# Patient Record
Sex: Male | Born: 1970
Health system: Southern US, Community
[De-identification: ages and names within clinical notes are randomized; demographics above are authoritative.]

## PROBLEM LIST (undated history)

## (undated) DIAGNOSIS — M7750 Other enthesopathy of unspecified foot: Secondary | ICD-10-CM

## (undated) DIAGNOSIS — G5702 Lesion of sciatic nerve, left lower limb: Secondary | ICD-10-CM

## (undated) HISTORY — DX: Lesion of sciatic nerve, left lower limb: G57.02

## (undated) HISTORY — PX: WISDOM TOOTH EXTRACTION: SHX21

## (undated) HISTORY — DX: Other enthesopathy of unspecified foot and ankle: M77.50

---

## 2009-04-08 ENCOUNTER — Emergency Department (HOSPITAL_COMMUNITY): Admission: EM | Admit: 2009-04-08 | Discharge: 2009-04-08 | Payer: Self-pay | Admitting: Emergency Medicine

## 2009-04-12 ENCOUNTER — Ambulatory Visit: Admission: RE | Admit: 2009-04-12 | Discharge: 2009-04-12 | Payer: Self-pay | Admitting: Diagnostic Radiology

## 2009-04-15 ENCOUNTER — Emergency Department (HOSPITAL_COMMUNITY): Admission: EM | Admit: 2009-04-15 | Discharge: 2009-04-15 | Payer: Self-pay | Admitting: Emergency Medicine

## 2009-04-22 ENCOUNTER — Emergency Department (HOSPITAL_COMMUNITY): Admission: EM | Admit: 2009-04-22 | Discharge: 2009-04-22 | Payer: Self-pay | Admitting: Emergency Medicine

## 2010-11-15 ENCOUNTER — Encounter
Admission: RE | Admit: 2010-11-15 | Discharge: 2010-11-15 | Payer: Self-pay | Source: Home / Self Care | Attending: Family Medicine | Admitting: Family Medicine

## 2010-12-25 ENCOUNTER — Other Ambulatory Visit: Payer: Self-pay | Admitting: Family Medicine

## 2010-12-25 ENCOUNTER — Ambulatory Visit
Admission: RE | Admit: 2010-12-25 | Discharge: 2010-12-25 | Disposition: A | Discharge status: Home / Self Care | Payer: BC Managed Care – PPO | Source: Ambulatory Visit | Attending: *Deleted | Admitting: *Deleted

## 2010-12-25 DIAGNOSIS — J189 Pneumonia, unspecified organism: Secondary | ICD-10-CM

## 2011-03-15 ENCOUNTER — Other Ambulatory Visit: Payer: Self-pay | Admitting: *Deleted

## 2011-03-15 ENCOUNTER — Other Ambulatory Visit: Payer: Self-pay | Admitting: Family Medicine

## 2011-03-15 DIAGNOSIS — J189 Pneumonia, unspecified organism: Secondary | ICD-10-CM

## 2015-04-27 ENCOUNTER — Ambulatory Visit: Payer: Self-pay | Admitting: Sports Medicine

## 2015-05-03 ENCOUNTER — Ambulatory Visit (INDEPENDENT_AMBULATORY_CARE_PROVIDER_SITE_OTHER): Payer: BLUE CROSS/BLUE SHIELD | Admitting: Sports Medicine

## 2015-05-03 ENCOUNTER — Encounter: Payer: Self-pay | Admitting: Sports Medicine

## 2015-05-03 VITALS — BP 100/57 | Ht 71.0 in | Wt 150.0 lb

## 2015-05-03 DIAGNOSIS — M7752 Other enthesopathy of left foot: Secondary | ICD-10-CM | POA: Diagnosis not present

## 2015-05-03 DIAGNOSIS — G5702 Lesion of sciatic nerve, left lower limb: Secondary | ICD-10-CM

## 2015-05-03 DIAGNOSIS — M7750 Other enthesopathy of unspecified foot: Secondary | ICD-10-CM | POA: Insufficient documentation

## 2015-05-03 HISTORY — DX: Lesion of sciatic nerve, left lower limb: G57.02

## 2015-05-03 HISTORY — DX: Other enthesopathy of unspecified foot and ankle: M77.50

## 2015-05-03 NOTE — Assessment & Plan Note (Signed)
-  Improving -Continue stretches and add hip rotation rehabilitation exercises

## 2015-05-03 NOTE — Progress Notes (Signed)
   Subjective:    Patient ID: Kyle Turner, male    DOB: 05-Jul-1971, 44 y.o.   MRN: [id number]  HPI Mr. Baloun is a 44 year old male new patient who presents with concern over left buttock and left heel pain. Onset of symptoms was last fall when he was training for some distance races. He enjoys doing marathons and 50 K races. He denies any acute injury. Location of the left heel pain is right over the distal Achilles tendon and just anterior to it in the region of the retrocalcaneal bursa. This is aggravated with running and relieved with rest. He has not tried any specific relieving factors. For the left buttock pain, location of pain is primarily in the left posterior gluteal region. He denies any radicular symptoms, numbness, tingling loss of bladder bowel, saddle anesthesia, groin pain, or hip clicking or catching. Symptoms are worse if he presses on it. He finds relief with doing the legs crossed pulling knees toward chest stretch. This is overall improving. He denies any recent changes in training regimen or footwear. No bruising or swelling.  Past medical history, social history, medications, and allergies were reviewed and are up to date in the chart.  Review of Systems 7 point review of systems was performed and was otherwise negative unless noted in the history of present illness.     Objective:   Physical Exam BP 100/57 mmHg  Ht 5\' 11"  (1.803 m)  Wt 150 lb (68.04 kg)  BMI 20.93 kg/m2 GEN: The patient is well-developed well-nourished male and in no acute distress.  He is awake alert and oriented x3. SKIN: warm and well-perfused, no rash  EXTR: No lower extremity edema or calf tenderness Neuro: Strength 5/5 globally. Sensation intact throughout. No focal deficits. Vasc: +2 bilateral distal pulses. No edema.  MSK: Gait analysis reveals that he is a forefoot striker and places much more pressure across the posterior gastrocsoleus complex to maintain his form. He initially has a  significant amount of forefoot motion, but this was corrected in green sport insoles. No significant leg length discrepancy. He is able to do a single leg heel lifts bilaterally, but this aggravates his left heel pain. The Achilles tendons are palpably intact without nodularity. No insertional Achilles pain. No bruising or swelling. No skin changes. He is tender over the left piriformis. Negative seated straight leg raise. Examination of the bilateral feet reveal that he has dropping of the right fourth metatarsal head. He has a Morton's foot bilaterally. Slight loss of the right transverse arch. Right mid-to-forefoot breakdown with dorsal bossing.   Limited musculoskeletal ultrasound: Long and short axis views were obtained of the bilateral Achilles tendons. These appear to be normal in caliber and without evidence of tearing. The left Achilles measures 0.42 cm in thickness and the right is 0.38 cm. On the left there appears to be an increased amount of retrocalcaneal bursal fluid with a small microcalcification.     Assessment & Plan:  Please see problem based assessment and plan in the problem list.

## 2015-05-03 NOTE — Assessment & Plan Note (Signed)
Secondary to his gait mechanics caused by impact at the posterior aspect of his running shoes -Green sport insoles were fitted and applied with bilateral small heel wedges, which improved his gait -He will do some eccentric Achilles exercises as well. -Plan follow-up in 6-8 weeks if needed or sooner if acutely worsened

## 2015-07-07 ENCOUNTER — Encounter: Payer: Self-pay | Admitting: Cardiology

## 2015-08-25 ENCOUNTER — Ambulatory Visit: Payer: BLUE CROSS/BLUE SHIELD | Admitting: Cardiovascular Disease

## 2015-08-30 ENCOUNTER — Ambulatory Visit (INDEPENDENT_AMBULATORY_CARE_PROVIDER_SITE_OTHER): Payer: BLUE CROSS/BLUE SHIELD | Admitting: Cardiology

## 2015-08-30 ENCOUNTER — Encounter: Payer: Self-pay | Admitting: *Deleted

## 2015-08-30 VITALS — BP 120/58 | HR 45 | Ht 71.0 in | Wt 152.0 lb

## 2015-08-30 DIAGNOSIS — R0602 Shortness of breath: Secondary | ICD-10-CM

## 2015-08-30 DIAGNOSIS — R079 Chest pain, unspecified: Secondary | ICD-10-CM | POA: Diagnosis not present

## 2015-08-30 NOTE — Progress Notes (Signed)
Cardiology Office Note   Date:  08/30/2015   ID:  Kyle AlbeeJohn Turner, DOB [birth year], MRN [id number]  PCP:  Jones BalesLANTELME,BRUCE E, MD    Chief Complaint  Patient presents with  . Shortness of Breath  . Chest Pain      History of Present Illness: Kyle Turner is a 44 y.o. male who presents for evaluation of SOB with running.  He has run a lot of marathons in the past but over the past 4 months he finds it harder to run with SOB and fatgue.  He has also noticed some vague discomfort in his left chest that is described as a fullness which is intermittent and lasts about 15 minutes and is nonexertional.  There is no radiation of the discomfort and no associated nausea/diaphoresis.  He denies any LE edema, palpitations or syncope.  He has noticed some dizziness when turning his head when he goes to lay down.     History reviewed. No pertinent past medical history.  Past Surgical History  Procedure Laterality Date  . Wisdom tooth extraction       No current outpatient prescriptions on file.   No current facility-administered medications for this visit.    Allergies:   Review of patient's allergies indicates no known allergies.    Social History:  The patient  reports that he has never smoked. He has never used smokeless tobacco. He reports that he drinks alcohol. He reports that he does not use illicit drugs.   Family History:  The patient's family history includes Heart disease in his paternal grandmother; Hypertension in his father and mother; Other in his father. There is no history of Heart failure, Fainting, or Stroke.    ROS:  Please see the history of present illness.   Otherwise, review of systems are positive for none.   All other systems are reviewed and negative.    PHYSICAL EXAM: VS:  BP 120/58 mmHg  Pulse 45  Ht 5\' 11"  (1.803 m)  Wt 152 lb (68.947 kg)  BMI 21.21 kg/m2 , BMI Body mass index is 21.21 kg/(m^2). GEN: Well nourished, well developed, in no  acute distress HEENT: normal Neck: no JVD, carotid bruits, or masses Cardiac: RRR; no murmurs, rubs, or gallops,no edema  Respiratory:  clear to auscultation bilaterally, normal work of breathing GI: soft, nontender, nondistended, + BS MS: no deformity or atrophy Skin: warm and dry, no rash Neuro:  Strength and sensation are intact Psych: euthymic mood, full affect   EKG:  EKG is ordered today. The ekg ordered today demonstrates sinus bradycardia at 45bpm with LVH by voltage    Recent Labs: No results found for requested labs within last 365 days.    Lipid Panel No results found for: CHOL, TRIG, HDL, CHOLHDL, VLDL, LDLCALC, LDLDIRECT    Wt Readings from Last 3 Encounters:  08/30/15 152 lb (68.947 kg)  05/03/15 150 lb (68.04 kg)        ASSESSMENT AND PLAN:  1.  Atypical chest pain that is very vague in description with no associated symptoms.  His EKG is nonischemic. I will get an ETT to rule out ischemia and also a coronary calcium score.  2.  SOB that is most likely due to deconditioning.  He is a marathon runner and backed off of running over the summer due to the heat and now notices SOB with running.  He has LVH on  his EKG.  I will get an echo to assess further.     Current medicines are reviewed at length with the patient today.  The patient does not have concerns regarding medicines.  The following changes have been made:  no change  Labs/ tests ordered today: See above Assessment and Plan No orders of the defined types were placed in this encounter.     Disposition:   FU with me PRN pending results of studies  SignedQuintella Reichert, MD  08/30/2015 11:44 AM    [facility] [address] [place], [place], [place]  [address] Phone: [phone]; Fax: [phone]

## 2015-08-30 NOTE — Patient Instructions (Addendum)
Medication Instructions:  Your physician recommends that you continue on your current medications as directed. Please refer to the Current Medication list given to you today.   Labwork: None  Testing/Procedures: Your physician has requested that you have an echocardiogram. Echocardiography is a painless test that uses sound waves to create images of your heart. It provides your doctor with information about the size and shape of your heart and how well your heart's chambers and valves are working. This procedure takes approximately one hour. There are no restrictions for this procedure.   Your physician has requested that you have an exercise tolerance test. For further information please visit [email or web address]. Please also follow instruction sheet, as given.  Dr. Mayford Knifeurner recommends you have a CALCIUM SCORE.  Follow-Up: Your physician recommends that you schedule a follow-up appointment AS NEEDED with Dr. Mayford Knifeurner pending your study results.  Any Other Special Instructions Will Be Listed Below (If Applicable).

## 2015-09-06 ENCOUNTER — Ambulatory Visit (INDEPENDENT_AMBULATORY_CARE_PROVIDER_SITE_OTHER)
Admission: RE | Admit: 2015-09-06 | Discharge: 2015-09-06 | Disposition: A | Discharge status: Home / Self Care | Payer: BLUE CROSS/BLUE SHIELD | Source: Ambulatory Visit | Attending: Cardiovascular Disease | Admitting: Cardiovascular Disease

## 2015-09-06 ENCOUNTER — Ambulatory Visit (HOSPITAL_COMMUNITY): Payer: BLUE CROSS/BLUE SHIELD | Attending: Cardiology

## 2015-09-06 ENCOUNTER — Other Ambulatory Visit: Payer: Self-pay

## 2015-09-06 DIAGNOSIS — R079 Chest pain, unspecified: Secondary | ICD-10-CM | POA: Insufficient documentation

## 2015-09-06 DIAGNOSIS — R0602 Shortness of breath: Secondary | ICD-10-CM | POA: Diagnosis not present

## 2015-09-06 DIAGNOSIS — I358 Other nonrheumatic aortic valve disorders: Secondary | ICD-10-CM | POA: Diagnosis not present

## 2015-09-08 ENCOUNTER — Telehealth: Payer: Self-pay | Admitting: Cardiology

## 2015-09-08 NOTE — Telephone Encounter (Signed)
Follow Up ° °Pt returned call//  °

## 2015-09-08 NOTE — Telephone Encounter (Signed)
Notified of Calcium score and findings of nodule in lung.  Will forward study to Dr. Samella ParrLanteime for further evaluation. Wanted results of Echo but advised had not been resulted.

## 2015-09-08 NOTE — Telephone Encounter (Signed)
New message ° ° ° ° ° ° °Returning a call to get test results °

## 2015-09-08 NOTE — Telephone Encounter (Signed)
LM to call back.

## 2015-09-13 ENCOUNTER — Encounter: Payer: Self-pay | Admitting: Cardiology

## 2015-09-13 ENCOUNTER — Ambulatory Visit (INDEPENDENT_AMBULATORY_CARE_PROVIDER_SITE_OTHER): Payer: BLUE CROSS/BLUE SHIELD

## 2015-09-13 ENCOUNTER — Encounter: Payer: BLUE CROSS/BLUE SHIELD | Admitting: Physician Assistant

## 2015-09-13 DIAGNOSIS — R0602 Shortness of breath: Secondary | ICD-10-CM

## 2015-09-13 DIAGNOSIS — R079 Chest pain, unspecified: Secondary | ICD-10-CM | POA: Diagnosis not present

## 2015-09-13 LAB — EXERCISE TOLERANCE TEST
CHL CUP RESTING HR STRESS: 41 {beats}/min
CSEPED: 17 min
Estimated workload: 20 METS
MPHR: 174 {beats}/min
Peak HR: 157 {beats}/min
Percent HR: 90 %
RPE: 18

## 2015-09-13 NOTE — Telephone Encounter (Signed)
New Message  Pt returning RN phone call concerning echo results. Please cal back and discuss.

## 2015-09-13 NOTE — Telephone Encounter (Signed)
This encounter was created in error - please disregard.

## 2015-09-29 ENCOUNTER — Ambulatory Visit: Payer: BLUE CROSS/BLUE SHIELD | Admitting: Podiatry

## 2015-10-02 ENCOUNTER — Ambulatory Visit: Payer: BLUE CROSS/BLUE SHIELD | Admitting: Podiatry

## 2015-10-03 ENCOUNTER — Ambulatory Visit (INDEPENDENT_AMBULATORY_CARE_PROVIDER_SITE_OTHER): Payer: BLUE CROSS/BLUE SHIELD | Admitting: Internal Medicine

## 2015-10-03 ENCOUNTER — Encounter: Payer: Self-pay | Admitting: Internal Medicine

## 2015-10-03 VITALS — BP 104/70 | HR 45 | Ht 70.5 in | Wt 153.4 lb

## 2015-10-03 DIAGNOSIS — R918 Other nonspecific abnormal finding of lung field: Secondary | ICD-10-CM

## 2015-10-03 DIAGNOSIS — R079 Chest pain, unspecified: Secondary | ICD-10-CM

## 2015-10-03 NOTE — Progress Notes (Signed)
Subjective:    Patient ID: Kyle Turner, male    DOB: [birth year],    MRN: [id number]  HPI  52 yowm never smoker/ marathon runner first notice L cp just below the L breast x around 2014 comes and goes and occurs avg 3 days/ week 30 -60 sec and neg cardiac w/u except for 2 nodules on R by Cardiac Ct so referred to pulmonary clinic 10/03/2015   10/03/2015 1st [facility] office visit/ Kyle Turner   Chief Complaint  Patient presents with  . Pulmonary Consult    Self referral. Pt c/o left lung discomfort for the past 2-3 yrs- comes and goes and occurs more with intense exercise.   cp always in same place beneath L breast  s radiation no relation to meals never disturbs sleep, not pleuritic or ex, had episode sitting on exam table x 30 secs and resolved spont, typical of the pain x 2 y.  Not limited by breathing from desired activities  Including intense aerobics, nl gxt 09/13/15 noted.  occ sense of sob/palp at rest but not related to the cp, same pattern goes back 15 y  No obvious day to day or daytime variability or assoc chronic cough or  chest tightness, subjective wheeze or overt sinus or hb symptoms. No unusual exp hx or h/o childhood pna/ asthma or knowledge of premature birth.  Sleeping ok without nocturnal  or early am exacerbation  of respiratory  c/o's or need for noct saba. Also denies any obvious fluctuation of symptoms with weather or environmental changes or other aggravating or alleviating factors except as outlined above   Current Medications, Allergies, Complete Past Medical History, Past Surgical History, Family History, and Social History were reviewed in [employer] record.         Review of Systems  Constitutional: Negative for fever, chills, activity change, appetite change and unexpected weight change.  HENT: Negative for congestion, dental problem, postnasal drip, rhinorrhea, sneezing, sore throat, trouble swallowing and voice change.   Eyes:  Negative for visual disturbance.  Respiratory: Positive for shortness of breath. Negative for cough and choking.   Cardiovascular: Positive for chest pain. Negative for leg swelling.  Gastrointestinal: Negative for nausea, vomiting and abdominal pain.  Genitourinary: Negative for difficulty urinating.  Musculoskeletal: Negative for arthralgias.  Skin: Negative for rash.  Psychiatric/Behavioral: Negative for behavioral problems and confusion.       Objective:   Physical Exam  amb wm nad  Wt Readings from Last 3 Encounters:  10/03/15 153 lb 6.4 oz (69.582 kg)  08/30/15 152 lb (68.947 kg)  05/03/15 150 lb (68.04 kg)    Vital signs reviewed  HEENT: nl dentition, turbinates, and oropharynx. Nl external ear canals without cough reflex   NECK :  without JVD/Nodes/TM/ nl carotid upstrokes bilaterally   LUNGS: no acc muscle use, clear to A and P bilaterally without cough on insp or exp maneuvers No chest wall tenderness or instability    CV:  RRR  no s3 or murmur or increase in P2, no edema   ABD:  soft and nontender with nl excursion in the supine position. No bruits or organomegaly, bowel sounds nl  MS:  warm without deformities, calf tenderness, cyanosis or clubbing  SKIN: warm and dry without lesions    NEURO:  alert, approp, no deficits      I personally reviewed images and agree with radiology impression as follows:  CT Chest 09/06/15 4 mm right middle lobe pulmonary nodule with  smaller 3 mm right lower lobe nodule associated. If the patient is at high risk for bronchogenic carcinoma, follow-up chest CT at 1 year is recommended. If the patient is at low risk, no follow-up is needed.      Assessment & Plan:

## 2015-10-03 NOTE — Patient Instructions (Signed)
Classic subdiaphragmatic pain pattern suggests irritable bowel syndrome / splenic flexure Syndrome :  Stereotypical, migratory with a very limited distribution of pain locations, daytime, not exacerbated by ex or coughing, worse in sitting position,   not present supine due to the dome effect of the diaphragm is  canceled in that position. Frequently these patients have had multiple negative GI workups and CT scans.  Treatment consists of avoiding foods that cause gas (especially all [place] food and beans, no boiled eggs,  and raw vegetables like spinach and salads, veggies)  and citrucel 1 heaping tsp twice daily with a large glass of water.  Pain should improve w/in 2 weeks and if not thenmight  consider further GI work up.   According the radiology guideline you not need any additional CT's

## 2015-10-04 ENCOUNTER — Ambulatory Visit (INDEPENDENT_AMBULATORY_CARE_PROVIDER_SITE_OTHER): Payer: BLUE CROSS/BLUE SHIELD

## 2015-10-04 ENCOUNTER — Ambulatory Visit (INDEPENDENT_AMBULATORY_CARE_PROVIDER_SITE_OTHER): Payer: BLUE CROSS/BLUE SHIELD | Admitting: Podiatry

## 2015-10-04 ENCOUNTER — Encounter: Payer: Self-pay | Admitting: Podiatry

## 2015-10-04 VITALS — BP 92/51 | HR 37 | Resp 16

## 2015-10-04 DIAGNOSIS — M2040 Other hammer toe(s) (acquired), unspecified foot: Secondary | ICD-10-CM | POA: Diagnosis not present

## 2015-10-04 DIAGNOSIS — M779 Enthesopathy, unspecified: Secondary | ICD-10-CM | POA: Diagnosis not present

## 2015-10-04 DIAGNOSIS — R918 Other nonspecific abnormal finding of lung field: Secondary | ICD-10-CM | POA: Insufficient documentation

## 2015-10-04 NOTE — Progress Notes (Signed)
   Subjective:    Patient ID: Kyle Turner, male    DOB: 1971-04-01, 44 y.o.   MRN: [id number]  HPI Patient presents with bilateral nail problem; great toes & 2nd toes. Pt wants all nails checked.  Patient also presents with bilateral foot pain; back of heel; x6-8 months.     Review of Systems  All other systems reviewed and are negative.      Objective:   Physical Exam        Assessment & Plan:

## 2015-10-04 NOTE — Patient Instructions (Signed)

## 2015-10-04 NOTE — Assessment & Plan Note (Signed)
See CT chest 09/06/15  None over 4 mm, never smoker, neg fm hx > no further studies needed per [facility] [employer] Guidelines

## 2015-10-04 NOTE — Assessment & Plan Note (Signed)
Location and pattern of pain are classic in my experience for splenic flexure syndrome, a form of IBS, esp the resolution w/in 1 min and timing (never supine, freq sitting)   rec rx as IBS then ? GI eval if not better, no pulmonary f/u needed  Total time devoted to counseling  = 35/2954m review case and multiple neg cards studies, ct scan images etc with pt/ discussion of options/alternatives/ giving and going over instructions (see avs)

## 2015-10-05 NOTE — Progress Notes (Signed)
Subjective:     Patient ID: Kyle AlbeeJohn Turner, male   DOB: 1971-05-17, 44 y.o.   MRN: [id number]  HPI patient states that he is a runner and is concerned about nail disease of the big toenails bilateral and also can get some pain around the right second joint where he knows he has arthritis and has elongated second toes   Review of Systems  All other systems reviewed and are negative.      Objective:   Physical Exam  Constitutional: He is oriented to person, place, and time.  Cardiovascular: Intact distal pulses.   Musculoskeletal: Normal range of motion.  Neurological: He is oriented to person, place, and time.  Skin: Skin is warm.  Nursing note and vitals reviewed.  neurovascular status intact muscle strength was adequate range of motion within normal limits with patient noted to have nail disease of the hallux second nailbeds bilateral with the nails being loose especially on the second and indications of arthritis of the second MPJ right with reduced range of motion of the joint surface. 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 At this point the nails are not fungal but are traumatic I do not recommend treatment and I did recommend padding with socks. Also for the second MPJ someday joint implant may be required or other treatment but at this point we will just put it on a watch and see how it responds

## 2015-10-23 ENCOUNTER — Other Ambulatory Visit: Payer: Self-pay | Admitting: Otolaryngology

## 2015-10-23 DIAGNOSIS — H9312 Tinnitus, left ear: Secondary | ICD-10-CM

## 2015-11-01 ENCOUNTER — Ambulatory Visit
Admission: RE | Admit: 2015-11-01 | Discharge: 2015-11-01 | Disposition: A | Discharge status: Home / Self Care | Payer: BLUE CROSS/BLUE SHIELD | Source: Ambulatory Visit | Attending: Otolaryngology | Admitting: Otolaryngology

## 2015-11-01 DIAGNOSIS — H9312 Tinnitus, left ear: Secondary | ICD-10-CM

## 2015-11-01 MED ORDER — GADOBENATE DIMEGLUMINE 529 MG/ML IV SOLN
14.0000 mL | Freq: Once | INTRAVENOUS | Status: AC | PRN
  Administered 2015-11-01: 14 mL via INTRAVENOUS

## 2015-12-24 IMAGING — CT CT HEART SCORING
1 of 3 series · 10 of 20 positions shown, 13 images · non-contrast
Comparison: None.

CLINICAL DATA: Risk stratification

EXAM:
Coronary Calcium Score
TECHNIQUE: The patient was scanned on a Siemens Sensation 16 slice scanner.
Axial non-contrast 3mm slices were carried out through the heart.
The data set was analyzed on a dedicated work station and scored
using the Agatson method.

[Series 6: st thins for reformat · axial · 0.68mm/px · z∈[-270,-144]mm · 10 of 155 slices shown, 13 images]
[im 15/155  vessel]
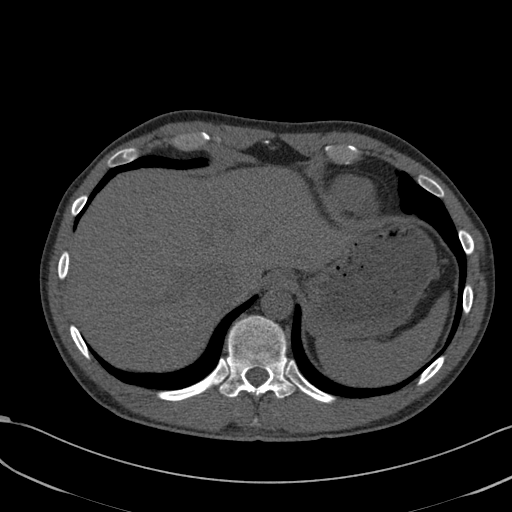
[im 15/155  lung]
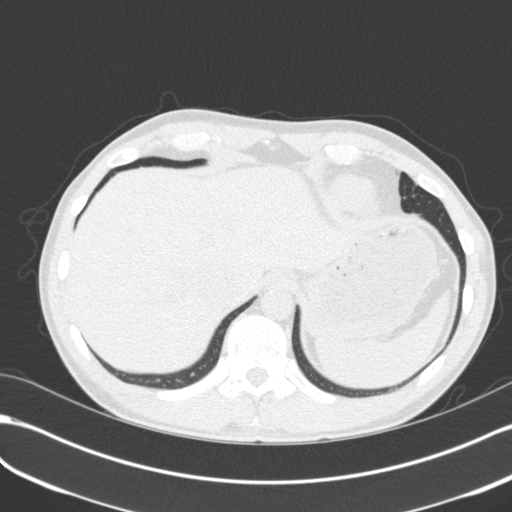
[im 29/155  vessel]
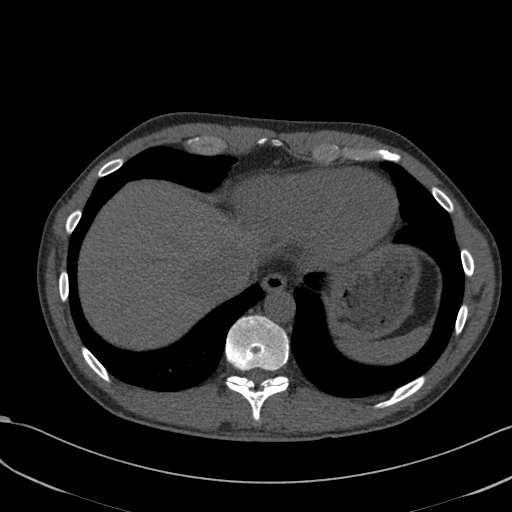
[im 43/155  vessel]
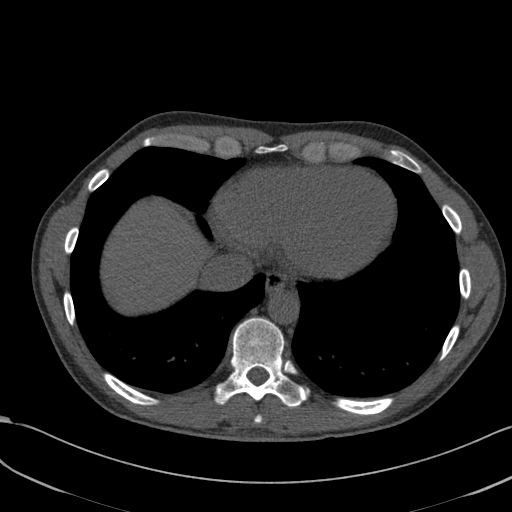
[im 57/155  vessel]
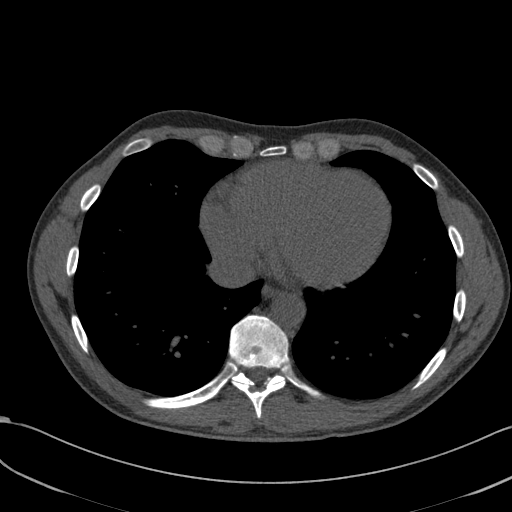
[im 71/155  vessel]
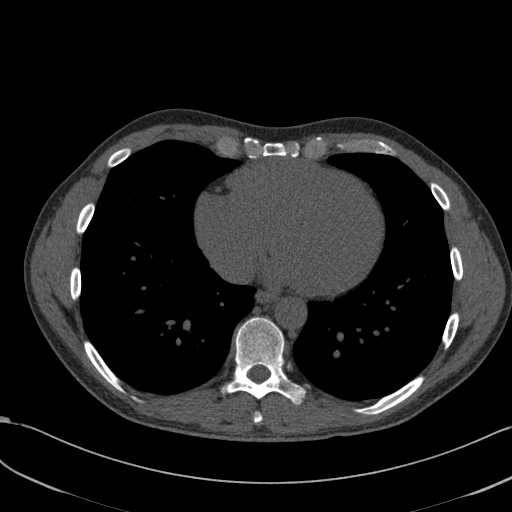
[im 71/155  lung]
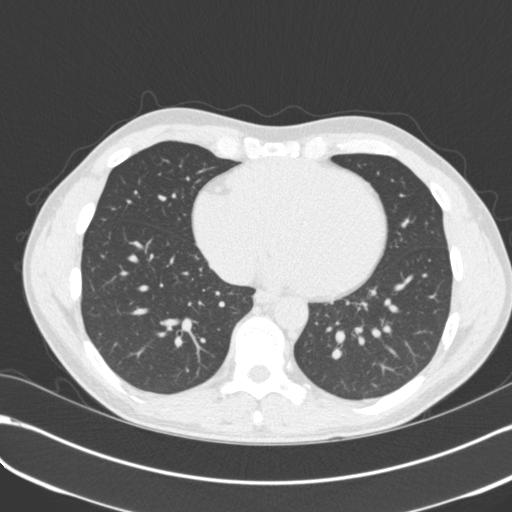
[im 85/155  vessel]
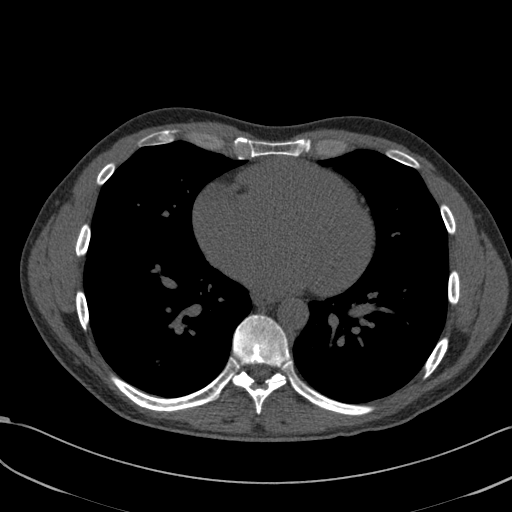
[im 99/155  vessel]
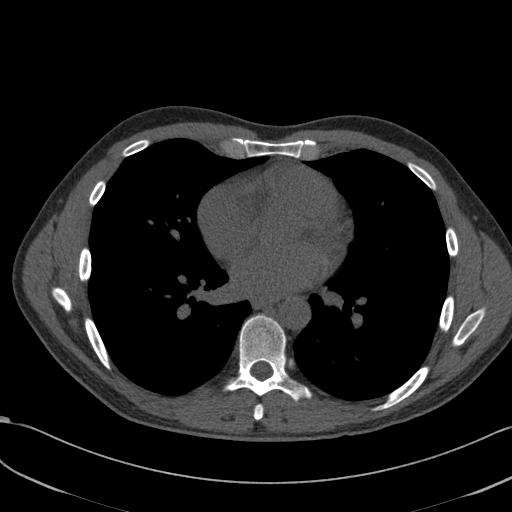
[im 113/155  vessel]
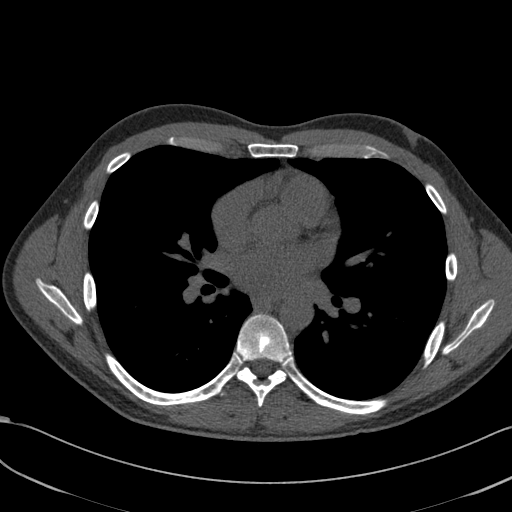
[im 127/155  vessel]
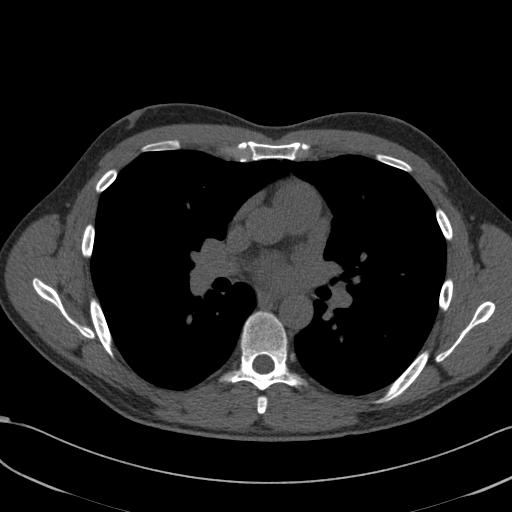
[im 127/155  lung]
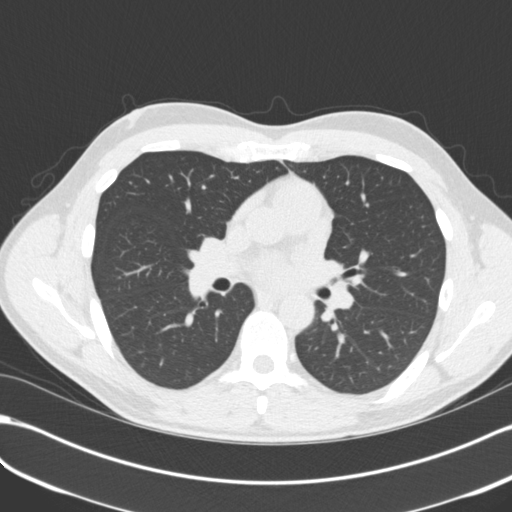
[im 141/155  vessel]
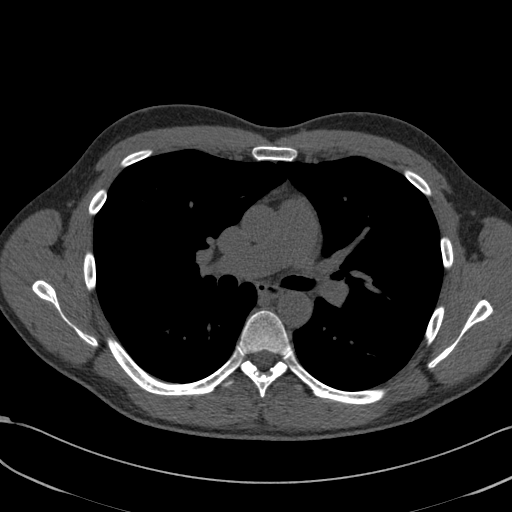

[10 of 20 positions shown; findings below may reference images not displayed]

FINDINGS: Non cardiac findings on limited lung and soft tissue windows. See
separate report from [REDACTED].

Ascending Aorta:  2.8 cm

Pericardium: Normal

Coronary arteries:  No coronary calcium detected
IMPRESSION: Coronary calcium score of 0. See radiology report regarding non
cardiac findings and 4 mm RML nodule

Abimelk Tiger

EXAM:
OVER-READ INTERPRETATION  CT CHEST

The following report is an over-read performed by radiologist Dr.
over-read does not include interpretation of cardiac or coronary
anatomy or pathology. The coronary calcium score interpretation by
the cardiologist is attached.
FINDINGS: No evidence for lymphadenopathy within the visualized mediastinum or
hilar regions. 4 mm nodule along the minor fissure (image 14 series
4) has triangular configuration and along with location suggests
this is most likely a subpleural lymph node. 4 mm right middle lobe
pulmonary nodule is nonspecific. 3 mm subpleural nodule is seen in
the right lower lobe on image 28. No evidence for focal airspace
consolidation or pulmonary edema within the visualize lungs. No
evidence for pleural effusion.

Bone windows reveal no worrisome lytic or sclerotic osseous lesions.
IMPRESSION: 4 mm right middle lobe pulmonary nodule with smaller 3 mm right
lower lobe nodule associated. If the patient is at high risk for
bronchogenic carcinoma, follow-up chest CT at 1 year is recommended.
If the patient is at low risk, no follow-up is needed. This
recommendation follows the consensus statement: Guidelines for
Management of Small Pulmonary Nodules Detected on CT Scans: A
Statement from the [HOSPITAL] as published in Radiology

## 2016-08-07 ENCOUNTER — Ambulatory Visit: Payer: BLUE CROSS/BLUE SHIELD | Admitting: Sports Medicine

## 2017-03-06 DIAGNOSIS — M7661 Achilles tendinitis, right leg: Secondary | ICD-10-CM | POA: Diagnosis not present

## 2017-03-06 DIAGNOSIS — M7631 Iliotibial band syndrome, right leg: Secondary | ICD-10-CM | POA: Diagnosis not present

## 2017-03-06 DIAGNOSIS — M7662 Achilles tendinitis, left leg: Secondary | ICD-10-CM | POA: Diagnosis not present

## 2017-03-06 DIAGNOSIS — M791 Myalgia: Secondary | ICD-10-CM | POA: Diagnosis not present

## 2017-03-12 DIAGNOSIS — M7662 Achilles tendinitis, left leg: Secondary | ICD-10-CM | POA: Diagnosis not present

## 2017-03-12 DIAGNOSIS — M7661 Achilles tendinitis, right leg: Secondary | ICD-10-CM | POA: Diagnosis not present

## 2017-03-12 DIAGNOSIS — M791 Myalgia: Secondary | ICD-10-CM | POA: Diagnosis not present

## 2017-03-12 DIAGNOSIS — M7631 Iliotibial band syndrome, right leg: Secondary | ICD-10-CM | POA: Diagnosis not present

## 2017-04-22 DIAGNOSIS — M9903 Segmental and somatic dysfunction of lumbar region: Secondary | ICD-10-CM | POA: Diagnosis not present

## 2017-04-22 DIAGNOSIS — M9904 Segmental and somatic dysfunction of sacral region: Secondary | ICD-10-CM | POA: Diagnosis not present

## 2017-04-22 DIAGNOSIS — M5432 Sciatica, left side: Secondary | ICD-10-CM | POA: Diagnosis not present

## 2017-04-22 DIAGNOSIS — M9905 Segmental and somatic dysfunction of pelvic region: Secondary | ICD-10-CM | POA: Diagnosis not present

## 2017-04-25 DIAGNOSIS — M5432 Sciatica, left side: Secondary | ICD-10-CM | POA: Diagnosis not present

## 2017-04-25 DIAGNOSIS — M9905 Segmental and somatic dysfunction of pelvic region: Secondary | ICD-10-CM | POA: Diagnosis not present

## 2017-04-25 DIAGNOSIS — M9903 Segmental and somatic dysfunction of lumbar region: Secondary | ICD-10-CM | POA: Diagnosis not present

## 2017-04-25 DIAGNOSIS — M9904 Segmental and somatic dysfunction of sacral region: Secondary | ICD-10-CM | POA: Diagnosis not present

## 2017-04-30 DIAGNOSIS — M9905 Segmental and somatic dysfunction of pelvic region: Secondary | ICD-10-CM | POA: Diagnosis not present

## 2017-04-30 DIAGNOSIS — M5432 Sciatica, left side: Secondary | ICD-10-CM | POA: Diagnosis not present

## 2017-04-30 DIAGNOSIS — M9903 Segmental and somatic dysfunction of lumbar region: Secondary | ICD-10-CM | POA: Diagnosis not present

## 2017-04-30 DIAGNOSIS — M9904 Segmental and somatic dysfunction of sacral region: Secondary | ICD-10-CM | POA: Diagnosis not present

## 2017-05-02 DIAGNOSIS — M9905 Segmental and somatic dysfunction of pelvic region: Secondary | ICD-10-CM | POA: Diagnosis not present

## 2017-05-02 DIAGNOSIS — M5432 Sciatica, left side: Secondary | ICD-10-CM | POA: Diagnosis not present

## 2017-05-02 DIAGNOSIS — M9903 Segmental and somatic dysfunction of lumbar region: Secondary | ICD-10-CM | POA: Diagnosis not present

## 2017-05-02 DIAGNOSIS — M9904 Segmental and somatic dysfunction of sacral region: Secondary | ICD-10-CM | POA: Diagnosis not present

## 2017-05-05 DIAGNOSIS — M9903 Segmental and somatic dysfunction of lumbar region: Secondary | ICD-10-CM | POA: Diagnosis not present

## 2017-05-05 DIAGNOSIS — M5432 Sciatica, left side: Secondary | ICD-10-CM | POA: Diagnosis not present

## 2017-05-05 DIAGNOSIS — M9905 Segmental and somatic dysfunction of pelvic region: Secondary | ICD-10-CM | POA: Diagnosis not present

## 2017-05-05 DIAGNOSIS — M9904 Segmental and somatic dysfunction of sacral region: Secondary | ICD-10-CM | POA: Diagnosis not present

## 2017-05-08 DIAGNOSIS — M9905 Segmental and somatic dysfunction of pelvic region: Secondary | ICD-10-CM | POA: Diagnosis not present

## 2017-05-08 DIAGNOSIS — M5432 Sciatica, left side: Secondary | ICD-10-CM | POA: Diagnosis not present

## 2017-05-08 DIAGNOSIS — M9903 Segmental and somatic dysfunction of lumbar region: Secondary | ICD-10-CM | POA: Diagnosis not present

## 2017-05-08 DIAGNOSIS — M9904 Segmental and somatic dysfunction of sacral region: Secondary | ICD-10-CM | POA: Diagnosis not present

## 2017-05-12 DIAGNOSIS — M9903 Segmental and somatic dysfunction of lumbar region: Secondary | ICD-10-CM | POA: Diagnosis not present

## 2017-05-12 DIAGNOSIS — M5432 Sciatica, left side: Secondary | ICD-10-CM | POA: Diagnosis not present

## 2017-05-12 DIAGNOSIS — M9904 Segmental and somatic dysfunction of sacral region: Secondary | ICD-10-CM | POA: Diagnosis not present

## 2017-05-12 DIAGNOSIS — M9905 Segmental and somatic dysfunction of pelvic region: Secondary | ICD-10-CM | POA: Diagnosis not present

## 2017-06-27 DIAGNOSIS — D2271 Melanocytic nevi of right lower limb, including hip: Secondary | ICD-10-CM | POA: Diagnosis not present

## 2017-06-27 DIAGNOSIS — D2261 Melanocytic nevi of right upper limb, including shoulder: Secondary | ICD-10-CM | POA: Diagnosis not present

## 2017-06-27 DIAGNOSIS — D225 Melanocytic nevi of trunk: Secondary | ICD-10-CM | POA: Diagnosis not present

## 2017-06-27 DIAGNOSIS — D1800 Hemangioma unspecified site: Secondary | ICD-10-CM | POA: Diagnosis not present

## 2017-10-16 DIAGNOSIS — E291 Testicular hypofunction: Secondary | ICD-10-CM | POA: Diagnosis not present

## 2017-10-16 DIAGNOSIS — Z0000 Encounter for general adult medical examination without abnormal findings: Secondary | ICD-10-CM | POA: Diagnosis not present

## 2017-10-16 DIAGNOSIS — E559 Vitamin D deficiency, unspecified: Secondary | ICD-10-CM | POA: Diagnosis not present

## 2017-11-28 DIAGNOSIS — M9904 Segmental and somatic dysfunction of sacral region: Secondary | ICD-10-CM | POA: Diagnosis not present

## 2017-11-28 DIAGNOSIS — M9903 Segmental and somatic dysfunction of lumbar region: Secondary | ICD-10-CM | POA: Diagnosis not present

## 2017-11-28 DIAGNOSIS — M9905 Segmental and somatic dysfunction of pelvic region: Secondary | ICD-10-CM | POA: Diagnosis not present

## 2017-11-28 DIAGNOSIS — M5441 Lumbago with sciatica, right side: Secondary | ICD-10-CM | POA: Diagnosis not present

## 2018-02-03 DIAGNOSIS — M9904 Segmental and somatic dysfunction of sacral region: Secondary | ICD-10-CM | POA: Diagnosis not present

## 2018-02-03 DIAGNOSIS — M5441 Lumbago with sciatica, right side: Secondary | ICD-10-CM | POA: Diagnosis not present

## 2018-02-03 DIAGNOSIS — M9903 Segmental and somatic dysfunction of lumbar region: Secondary | ICD-10-CM | POA: Diagnosis not present

## 2018-02-03 DIAGNOSIS — M9905 Segmental and somatic dysfunction of pelvic region: Secondary | ICD-10-CM | POA: Diagnosis not present

## 2018-02-06 DIAGNOSIS — M9905 Segmental and somatic dysfunction of pelvic region: Secondary | ICD-10-CM | POA: Diagnosis not present

## 2018-02-06 DIAGNOSIS — M9903 Segmental and somatic dysfunction of lumbar region: Secondary | ICD-10-CM | POA: Diagnosis not present

## 2018-02-06 DIAGNOSIS — M9904 Segmental and somatic dysfunction of sacral region: Secondary | ICD-10-CM | POA: Diagnosis not present

## 2018-02-06 DIAGNOSIS — M5441 Lumbago with sciatica, right side: Secondary | ICD-10-CM | POA: Diagnosis not present

## 2018-02-13 DIAGNOSIS — M9903 Segmental and somatic dysfunction of lumbar region: Secondary | ICD-10-CM | POA: Diagnosis not present

## 2018-02-13 DIAGNOSIS — M9904 Segmental and somatic dysfunction of sacral region: Secondary | ICD-10-CM | POA: Diagnosis not present

## 2018-02-13 DIAGNOSIS — M9905 Segmental and somatic dysfunction of pelvic region: Secondary | ICD-10-CM | POA: Diagnosis not present

## 2018-02-13 DIAGNOSIS — M5441 Lumbago with sciatica, right side: Secondary | ICD-10-CM | POA: Diagnosis not present

## 2018-02-18 DIAGNOSIS — M9903 Segmental and somatic dysfunction of lumbar region: Secondary | ICD-10-CM | POA: Diagnosis not present

## 2018-02-18 DIAGNOSIS — M9904 Segmental and somatic dysfunction of sacral region: Secondary | ICD-10-CM | POA: Diagnosis not present

## 2018-02-18 DIAGNOSIS — M5441 Lumbago with sciatica, right side: Secondary | ICD-10-CM | POA: Diagnosis not present

## 2018-02-18 DIAGNOSIS — M9905 Segmental and somatic dysfunction of pelvic region: Secondary | ICD-10-CM | POA: Diagnosis not present

## 2018-02-27 DIAGNOSIS — M9905 Segmental and somatic dysfunction of pelvic region: Secondary | ICD-10-CM | POA: Diagnosis not present

## 2018-02-27 DIAGNOSIS — M9904 Segmental and somatic dysfunction of sacral region: Secondary | ICD-10-CM | POA: Diagnosis not present

## 2018-02-27 DIAGNOSIS — M5441 Lumbago with sciatica, right side: Secondary | ICD-10-CM | POA: Diagnosis not present

## 2018-02-27 DIAGNOSIS — M9903 Segmental and somatic dysfunction of lumbar region: Secondary | ICD-10-CM | POA: Diagnosis not present

## 2018-05-01 DIAGNOSIS — M9904 Segmental and somatic dysfunction of sacral region: Secondary | ICD-10-CM | POA: Diagnosis not present

## 2018-05-01 DIAGNOSIS — M9903 Segmental and somatic dysfunction of lumbar region: Secondary | ICD-10-CM | POA: Diagnosis not present

## 2018-05-01 DIAGNOSIS — M5441 Lumbago with sciatica, right side: Secondary | ICD-10-CM | POA: Diagnosis not present

## 2018-05-01 DIAGNOSIS — M9905 Segmental and somatic dysfunction of pelvic region: Secondary | ICD-10-CM | POA: Diagnosis not present

## 2018-06-08 DIAGNOSIS — M9905 Segmental and somatic dysfunction of pelvic region: Secondary | ICD-10-CM | POA: Diagnosis not present

## 2018-06-08 DIAGNOSIS — M9903 Segmental and somatic dysfunction of lumbar region: Secondary | ICD-10-CM | POA: Diagnosis not present

## 2018-06-08 DIAGNOSIS — M5441 Lumbago with sciatica, right side: Secondary | ICD-10-CM | POA: Diagnosis not present

## 2018-06-08 DIAGNOSIS — M9904 Segmental and somatic dysfunction of sacral region: Secondary | ICD-10-CM | POA: Diagnosis not present

## 2018-06-11 DIAGNOSIS — M9903 Segmental and somatic dysfunction of lumbar region: Secondary | ICD-10-CM | POA: Diagnosis not present

## 2018-06-11 DIAGNOSIS — M9904 Segmental and somatic dysfunction of sacral region: Secondary | ICD-10-CM | POA: Diagnosis not present

## 2018-06-11 DIAGNOSIS — M5441 Lumbago with sciatica, right side: Secondary | ICD-10-CM | POA: Diagnosis not present

## 2018-06-11 DIAGNOSIS — M9905 Segmental and somatic dysfunction of pelvic region: Secondary | ICD-10-CM | POA: Diagnosis not present

## 2018-06-15 DIAGNOSIS — M5441 Lumbago with sciatica, right side: Secondary | ICD-10-CM | POA: Diagnosis not present

## 2018-06-15 DIAGNOSIS — M9904 Segmental and somatic dysfunction of sacral region: Secondary | ICD-10-CM | POA: Diagnosis not present

## 2018-06-15 DIAGNOSIS — M9905 Segmental and somatic dysfunction of pelvic region: Secondary | ICD-10-CM | POA: Diagnosis not present

## 2018-06-17 ENCOUNTER — Ambulatory Visit (INDEPENDENT_AMBULATORY_CARE_PROVIDER_SITE_OTHER): Payer: BLUE CROSS/BLUE SHIELD | Admitting: Family Medicine

## 2018-06-17 ENCOUNTER — Encounter: Payer: Self-pay | Admitting: Family Medicine

## 2018-06-17 VITALS — BP 110/62 | Ht 70.0 in | Wt 155.0 lb

## 2018-06-17 DIAGNOSIS — M25551 Pain in right hip: Secondary | ICD-10-CM | POA: Diagnosis not present

## 2018-06-17 MED ORDER — NITROGLYCERIN 0.2 MG/HR TD PT24: MEDICATED_PATCH | TRANSDERMAL | 1 refills | Status: AC

## 2018-06-17 NOTE — Patient Instructions (Signed)
You have piriformis syndrome with secondary proximal hamstring strain/tendinopathy. Try to avoid painful activities when possible - cross training over next 2 weeks then start walk:jog Pick 2-3 stretches where you feel the pull in the area of pain - do 3 of these and hold for 20-30 seconds twice a day. Hamstring curls, hamstring swings, standing hip rotations, hip side raises 3 sets of 10 once a day. Add ankle weights if these become too easy. Ibuprofen or aleve only if needed. Continue with [facility] twice a week. Nitro patches 1/4th patch to affected area, change daily. Follow up with me in 5 weeks.

## 2018-06-17 NOTE — Progress Notes (Signed)
PCP: Jones BalesLantelme, Kyle E, MD  Subjective:   HPI: Patient is a 47 y.o. male here for right hip pain.  Patient is an avid [place] He is due to run another marathon at the end of October. He is running about 40 miles per week with his long run 14 miles. He states about 2 weeks ago he started to get a sharp pain in the posterior aspect of the right hip radiating into the hamstring. Pain worse at the start of the runs a couple times after this. Pain is sharp and increases from there and has been about a 7 or 8 out of 10 level. He has had some achiness in both hips for about 3 to 4 years now. He is tried some rehab exercises [facility], he is also done active release off-and-on. No skin changes, numbness. No back pain.  Past Medical History:  Diagnosis Date  . Piriformis syndrome of left side 05/03/2015  . Retrocalcaneal bursitis 05/03/2015    No current outpatient medications on file prior to visit.   No current facility-administered medications on file prior to visit.     Past Surgical History:  Procedure Laterality Date  . WISDOM TOOTH EXTRACTION      No Known Allergies  Social History   Socioeconomic History  . Marital status: Married    Spouse name: Not on file  . Number of children: Not on file  . Years of education: Not on file  . Highest education level: Not on file  Occupational History  . Not on file  Social Needs  . Financial resource strain: Not on file  . Food insecurity:    Worry: Not on file    Inability: Not on file  . Transportation needs:    Medical: Not on file    Non-medical: Not on file  Tobacco Use  . Smoking status: Never Smoker  . Smokeless tobacco: Never Used  Substance and Sexual Activity  . Alcohol use: Yes    Alcohol/week: 0.0 oz    Comment: occasionally  . Drug use: No  . Sexual activity: Not on file  Lifestyle  . Physical activity:    Days per  week: Not on file    Minutes per session: Not on file  . Stress: Not on file  Relationships  . Social connections:    Talks on phone: Not on file    Gets together: Not on file    Attends religious service: Not on file    Active member of club or organization: Not on file    Attends meetings of clubs or organizations: Not on file    Relationship status: Not on file  . Intimate partner violence:    Fear of current or ex partner: Not on file    Emotionally abused: Not on file    Physically abused: Not on file    Forced sexual activity: Not on file  Other Topics Concern  . Not on file  Social History Narrative  . Not on file    Family History  Problem Relation Age of Onset  . Hypertension Mother   . Hypertension Father   . Other Father        abnormal EKG's  . Heart failure Neg Hx   . Fainting Neg Hx   . Stroke Neg Hx   . Heart disease Paternal Grandmother     BP 110/62   Ht 5\' 10"  (  1.778 m)   Wt 155 lb (70.3 kg)   BMI 22.24 kg/m   Review of Systems: See HPI above.     Objective:  Physical Exam:  Gen: NAD, comfortable in exam room  Back: No gross deformity, scoliosis. No paraspinal TTP.  No midline or bony TTP. FROM without pain (tightness in both hamstrings). Strength LEs 5/5 all muscle groups. 2+ MSRs in patellar and achilles tendons, equal bilaterally. Negative SLRs. Sensation intact to light touch bilaterally. Negative logroll bilateral hips Negative fabers and piriformis stretches.  Right hip: No deformity. FROM with 5/5 strength including hip abduction.  5-/5 with knee flexion at 30 degrees, mild pain proximal hamstring Mild TTP piriformis.  No other tenderness. NVI distally.  Gait:  Forefoot striker, very mild outturning of both feet.  No abnormalities of hips or knees though shortened stride on right compared to left.   Assessment & Plan:  1. Right hip pain - 2/2 piriformis syndrome with secondary proximal hamstring strain/tendinopathy.  Shown  home exercises and stretches to do daily.  Continue rehab and ART at [facility].  Ibuprofen or aleve only if needed.  Start nitro patches as well.  Cross train over next 2 weeks then attempt to resume walk:jog on flat surface.  F/u in 5 weeks.

## 2018-06-19 DIAGNOSIS — M5441 Lumbago with sciatica, right side: Secondary | ICD-10-CM | POA: Diagnosis not present

## 2018-06-19 DIAGNOSIS — M9904 Segmental and somatic dysfunction of sacral region: Secondary | ICD-10-CM | POA: Diagnosis not present

## 2018-06-19 DIAGNOSIS — M9905 Segmental and somatic dysfunction of pelvic region: Secondary | ICD-10-CM | POA: Diagnosis not present

## 2018-06-19 DIAGNOSIS — M9903 Segmental and somatic dysfunction of lumbar region: Secondary | ICD-10-CM | POA: Diagnosis not present

## 2018-06-25 DIAGNOSIS — M9903 Segmental and somatic dysfunction of lumbar region: Secondary | ICD-10-CM | POA: Diagnosis not present

## 2018-06-25 DIAGNOSIS — M5441 Lumbago with sciatica, right side: Secondary | ICD-10-CM | POA: Diagnosis not present

## 2018-06-25 DIAGNOSIS — M9904 Segmental and somatic dysfunction of sacral region: Secondary | ICD-10-CM | POA: Diagnosis not present

## 2018-06-25 DIAGNOSIS — M9905 Segmental and somatic dysfunction of pelvic region: Secondary | ICD-10-CM | POA: Diagnosis not present

## 2018-06-29 DIAGNOSIS — M5441 Lumbago with sciatica, right side: Secondary | ICD-10-CM | POA: Diagnosis not present

## 2018-06-29 DIAGNOSIS — M9903 Segmental and somatic dysfunction of lumbar region: Secondary | ICD-10-CM | POA: Diagnosis not present

## 2018-06-29 DIAGNOSIS — M9905 Segmental and somatic dysfunction of pelvic region: Secondary | ICD-10-CM | POA: Diagnosis not present

## 2018-06-29 DIAGNOSIS — M9904 Segmental and somatic dysfunction of sacral region: Secondary | ICD-10-CM | POA: Diagnosis not present

## 2018-07-15 ENCOUNTER — Ambulatory Visit: Payer: BLUE CROSS/BLUE SHIELD | Admitting: Family Medicine

## 2018-07-15 ENCOUNTER — Encounter: Payer: Self-pay | Admitting: Sports Medicine

## 2018-07-15 ENCOUNTER — Ambulatory Visit (INDEPENDENT_AMBULATORY_CARE_PROVIDER_SITE_OTHER): Payer: BLUE CROSS/BLUE SHIELD | Admitting: Sports Medicine

## 2018-07-15 VITALS — BP 104/66 | Ht 70.0 in | Wt 155.0 lb

## 2018-07-15 DIAGNOSIS — G5702 Lesion of sciatic nerve, left lower limb: Secondary | ICD-10-CM | POA: Diagnosis not present

## 2018-07-15 DIAGNOSIS — M545 Low back pain, unspecified: Secondary | ICD-10-CM

## 2018-07-15 NOTE — Patient Instructions (Signed)
Xray of lumbar spine ordered. Will call with the results.  Referral to [facility] for PT for presumed piriformis syndrome. Recommend 2 Aleve twice per day (440mg  twice daily) for 2 weeks and then as needed afterwards.  Discussed symptoms may be from low back vs piriformis muscle. Work up if not improving includes: MRI lumbar spine with subsequent consideration of injections if you have a bulging disc on the R side.

## 2018-07-15 NOTE — Progress Notes (Signed)
Dano Sarabia - 47 y.o. male MRN [id number]  Date of birth: [birth year]   Chief complaint: R gluteal pain  SUBJECTIVE:    History of present illness: Yunior is a 47yo male who presents today for follow-up of right gluteal pain and presumed sciatica.  He is an avid [employer] runner and has been struggling with symptoms of right sided gluteal pain that radiates into his posterior mid thigh.  His pain is rated a 7 out of 10 in nature. He states after 4 weeks of conservative therapy he is not any better.  He has not run in over 4 weeks.  He attempted to run 2 miles yesterday however he started getting radiating pain and numbness in his posterior thigh which caused him to stop running.  He states that his symptoms usually begin with activity or 1 to 2 hours after activity.  They do resolve with relative rest and now longer running.  He has had symptoms like this in the past on the left side and was treated for piriformis syndrome with formal physical therapy.  He states he does have mild associated low back pain.  He has never injured his right leg or low back before.  No prior surgeries.  Denies groin pain or knee pain.  On a normal basis when he is not uncomfortable, he runs 40 miles per week with his long runs being approximately 14 miles.  He also has tried chiropractic manipulation which did not help with symptoms.  He is not taking anti-inflammatories for this.   Review of systems:  As stated above   Interval past medical history, surgical history, family history, and social history obtained and are unchanged.   Medications reviewed and unchanged. Allergies reviewed and unchanged.  OBJECTIVE:  Physical exam: Vital signs are reviewed. BP 104/66   Ht 5\' 10"  (1.778 m)   Wt 155 lb (70.3 kg)   BMI 22.24 kg/m   Gen.: Alert, oriented, appears stated age, in no apparent distress Respiratory: Normal respirations, able to speak in full sentences Cardiac: Regular rate, distal pulses  2+ Integumentary: No rashes or ecchymoses Gait: normal without associated limp Musculoskeletal: Inspection of the right lower extremity demonstrates no acute normality or deformity.  No ecchymoses.  Tenderness to palpation in the proximal hamstring/biceps for Morris region.  He also has deep gluteal tenderness on the right side.  His tenderness is reproducible with a piriformis stretch.  He has full range of motion in hip flexion, extension, abduction and adduction.  Strength testing 5 out of 5 in all ranges of motion for the hip.  Negative logroll test.  Negative Stinchfield test.  Discomfort with Pearlean Brownie testing.  Equivocal straight leg raise test with pain and radiating symptoms through the course of his upper posterior thigh however not below the level of the knee.  Neurovascularly intact on exam today.  Reflexes +2 patellar and Achilles tendons and equal bilaterally.    ASSESSMENT & PLAN: Piriformis syndrome of left side Xray of lumbar spine ordered to investigate other etiologies of Sciatica. Will call with the results.  Referral to [facility] for PT for presumed piriformis syndrome. Recommend 2 Aleve twice per day (440mg  twice daily) for 2 weeks and then as needed afterwards.  Discussed symptoms may be from low back vs piriformis muscle. Work up if not improving includes: MRI lumbar spine with subsequent consideration of injections if he has an acute disc consistent with his symptoms.  F/u in 4 weeks. May benefit from steroid dosepack if  develops radicular symptoms into lower leg. Not currently having those today. May stop nitro patch as it does not appear to be helping.   Orders Placed This Encounter  Procedures  . DG Lumbar Spine 2-3 Views    Standing Status:   Future    Standing Expiration Date:   09/15/2019    Order Specific Question:   Reason for Exam (SYMPTOM  OR DIAGNOSIS REQUIRED)    Answer:   low back pain    Order Specific Question:   Preferred imaging location?     Answer:   [facility]    Order Specific Question:   Radiology Contrast Protocol - do NOT remove file path    Answer:   [email or web address]       Gustavus Messing, DO Sports Medicine Fellow [facility]

## 2018-07-15 NOTE — Assessment & Plan Note (Signed)
Xray of lumbar spine ordered to investigate other etiologies of Sciatica. Will call with the results.  Referral to [facility] Barter for PT for presumed piriformis syndrome. Recommend 2 Aleve twice per day (440mg  twice daily) for 2 weeks and then as needed afterwards.  Discussed symptoms may be from low back vs piriformis muscle. Work up if not improving includes: MRI lumbar spine with subsequent consideration of injections if you have a bulging disc on the R side.  F/u in 4 weeks. May benefit from steroid dosepak if develops radicular symptoms into lower leg. Not currently having those today.

## 2018-07-17 ENCOUNTER — Ambulatory Visit
Admission: RE | Admit: 2018-07-17 | Discharge: 2018-07-17 | Disposition: A | Discharge status: Home / Self Care | Payer: BLUE CROSS/BLUE SHIELD | Source: Ambulatory Visit | Attending: Sports Medicine | Admitting: Sports Medicine

## 2018-07-17 DIAGNOSIS — M545 Low back pain, unspecified: Secondary | ICD-10-CM

## 2018-07-30 DIAGNOSIS — G5700 Lesion of sciatic nerve, unspecified lower limb: Secondary | ICD-10-CM | POA: Diagnosis not present

## 2018-08-06 DIAGNOSIS — G5700 Lesion of sciatic nerve, unspecified lower limb: Secondary | ICD-10-CM | POA: Diagnosis not present

## 2018-08-12 DIAGNOSIS — G5700 Lesion of sciatic nerve, unspecified lower limb: Secondary | ICD-10-CM | POA: Diagnosis not present

## 2018-08-20 DIAGNOSIS — G5700 Lesion of sciatic nerve, unspecified lower limb: Secondary | ICD-10-CM | POA: Diagnosis not present

## 2018-08-26 DIAGNOSIS — G5700 Lesion of sciatic nerve, unspecified lower limb: Secondary | ICD-10-CM | POA: Diagnosis not present

## 2018-09-08 DIAGNOSIS — G5700 Lesion of sciatic nerve, unspecified lower limb: Secondary | ICD-10-CM | POA: Diagnosis not present

## 2018-10-07 DIAGNOSIS — M9903 Segmental and somatic dysfunction of lumbar region: Secondary | ICD-10-CM | POA: Diagnosis not present

## 2018-10-07 DIAGNOSIS — M9904 Segmental and somatic dysfunction of sacral region: Secondary | ICD-10-CM | POA: Diagnosis not present

## 2018-10-07 DIAGNOSIS — M5441 Lumbago with sciatica, right side: Secondary | ICD-10-CM | POA: Diagnosis not present

## 2018-10-07 DIAGNOSIS — M9905 Segmental and somatic dysfunction of pelvic region: Secondary | ICD-10-CM | POA: Diagnosis not present

## 2018-10-09 DIAGNOSIS — E291 Testicular hypofunction: Secondary | ICD-10-CM | POA: Diagnosis not present

## 2018-10-09 DIAGNOSIS — E039 Hypothyroidism, unspecified: Secondary | ICD-10-CM | POA: Diagnosis not present

## 2018-10-09 DIAGNOSIS — Z0001 Encounter for general adult medical examination with abnormal findings: Secondary | ICD-10-CM | POA: Diagnosis not present

## 2018-10-09 DIAGNOSIS — R790 Abnormal level of blood mineral: Secondary | ICD-10-CM | POA: Diagnosis not present

## 2018-10-09 DIAGNOSIS — E559 Vitamin D deficiency, unspecified: Secondary | ICD-10-CM | POA: Diagnosis not present

## 2018-10-20 DIAGNOSIS — E291 Testicular hypofunction: Secondary | ICD-10-CM | POA: Diagnosis not present

## 2018-10-20 DIAGNOSIS — Z0000 Encounter for general adult medical examination without abnormal findings: Secondary | ICD-10-CM | POA: Diagnosis not present

## 2018-10-20 DIAGNOSIS — E039 Hypothyroidism, unspecified: Secondary | ICD-10-CM | POA: Diagnosis not present

## 2018-10-20 DIAGNOSIS — E538 Deficiency of other specified B group vitamins: Secondary | ICD-10-CM | POA: Diagnosis not present

## 2018-11-03 IMAGING — CR DG LUMBAR SPINE 2-3V
3 series · 3 of 3 positions shown · non-contrast
Comparison: None.

CLINICAL DATA: Low back and bilateral hip pain with no known
injury.

EXAM:
LUMBAR SPINE - 2-3 VIEW

[t l-spine a.p.]
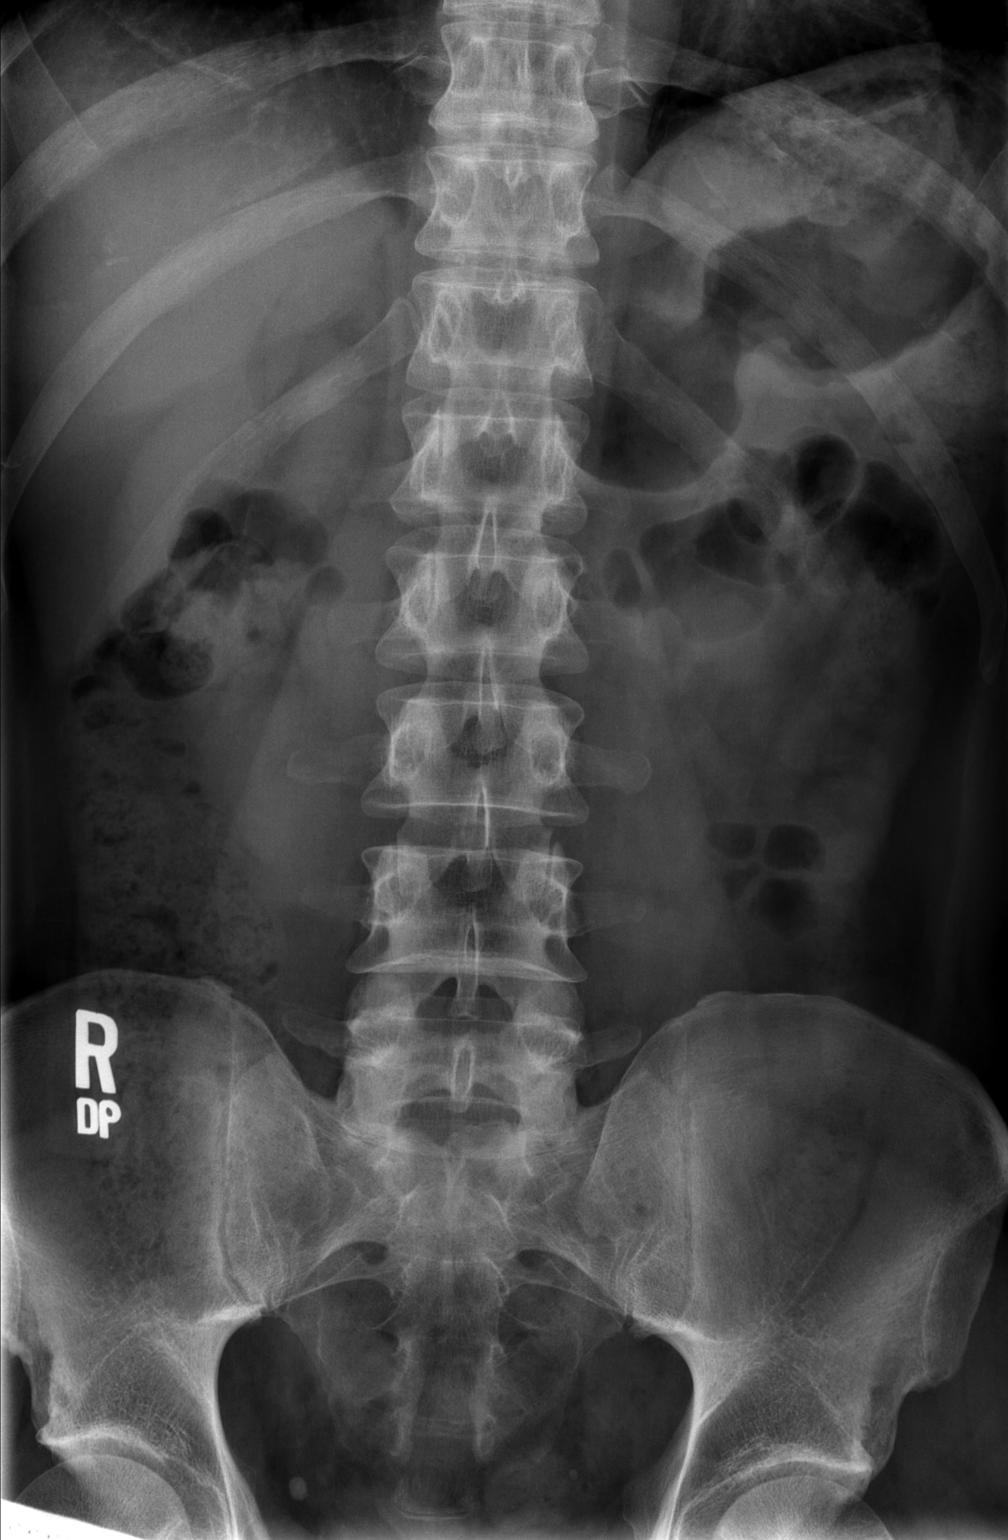

[t l-spine lat]
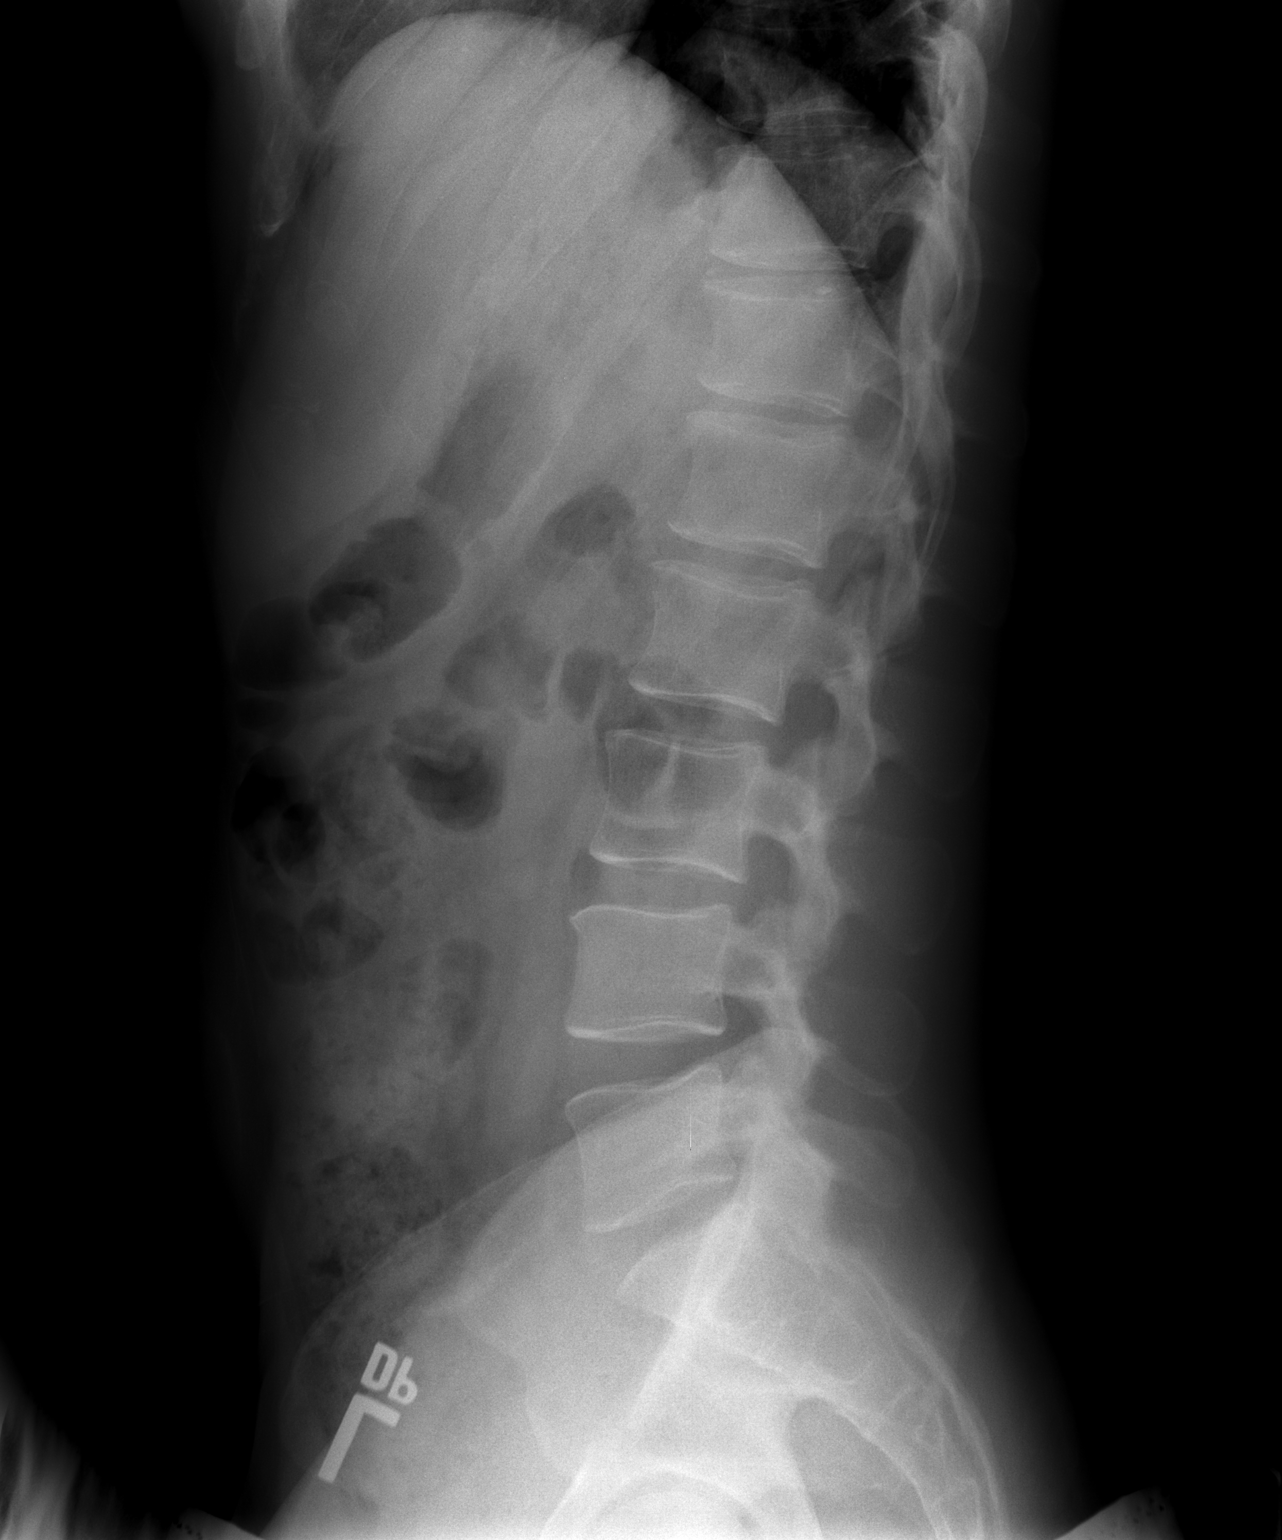

[t l-spine l5-s1 spot]
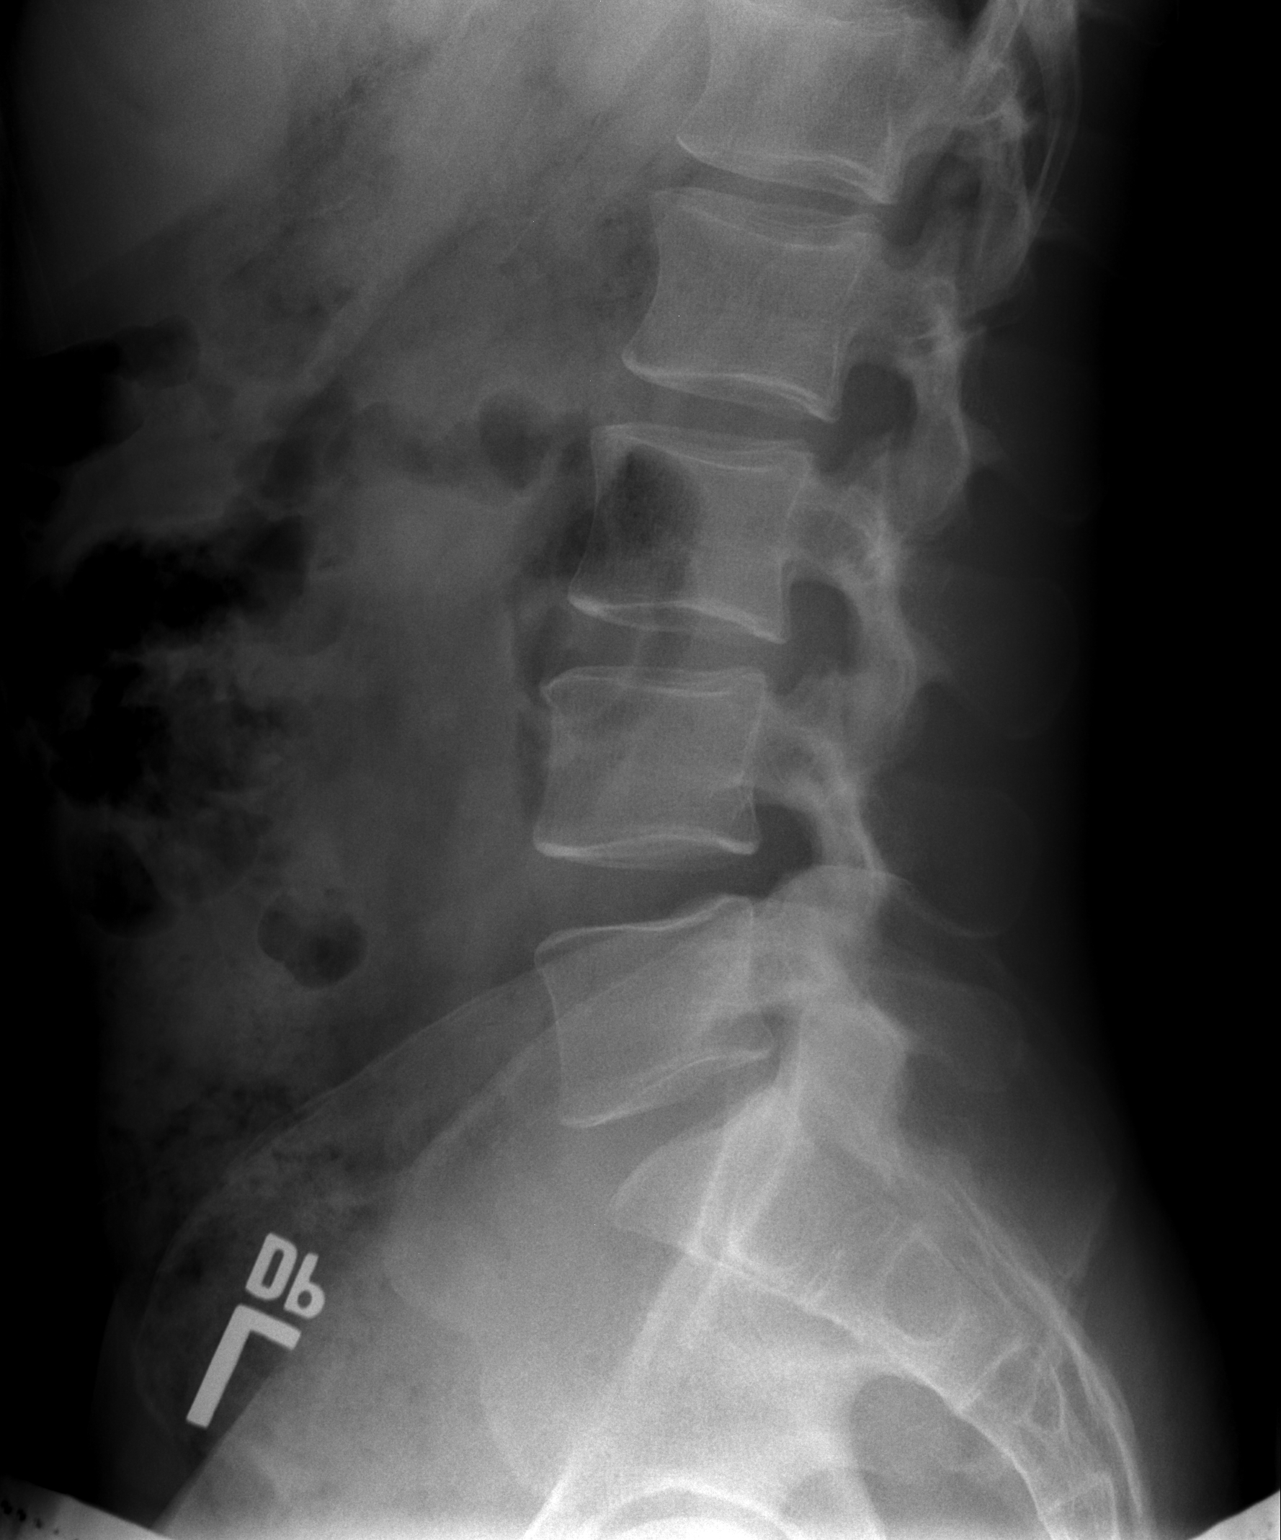

[3 of 3 positions shown; findings below may reference images not displayed]

FINDINGS: The lumbar vertebral bodies are preserved in height. The disc space
heights are well maintained. The pedicles and transverse processes
are intact. There is no spondylolisthesis.
IMPRESSION: There is no acute or significant chronic bony abnormality of the
lumbar spine.

## 2019-06-29 DIAGNOSIS — D225 Melanocytic nevi of trunk: Secondary | ICD-10-CM | POA: Diagnosis not present

## 2019-06-29 DIAGNOSIS — D2261 Melanocytic nevi of right upper limb, including shoulder: Secondary | ICD-10-CM | POA: Diagnosis not present

## 2019-06-29 DIAGNOSIS — L821 Other seborrheic keratosis: Secondary | ICD-10-CM | POA: Diagnosis not present

## 2019-06-29 DIAGNOSIS — D2271 Melanocytic nevi of right lower limb, including hip: Secondary | ICD-10-CM | POA: Diagnosis not present

## 2019-10-05 DIAGNOSIS — Z20828 Contact with and (suspected) exposure to other viral communicable diseases: Secondary | ICD-10-CM | POA: Diagnosis not present

## 2019-10-05 DIAGNOSIS — Z136 Encounter for screening for cardiovascular disorders: Secondary | ICD-10-CM | POA: Diagnosis not present

## 2019-11-11 DIAGNOSIS — M9903 Segmental and somatic dysfunction of lumbar region: Secondary | ICD-10-CM | POA: Diagnosis not present

## 2019-11-11 DIAGNOSIS — M9905 Segmental and somatic dysfunction of pelvic region: Secondary | ICD-10-CM | POA: Diagnosis not present

## 2019-11-11 DIAGNOSIS — M9902 Segmental and somatic dysfunction of thoracic region: Secondary | ICD-10-CM | POA: Diagnosis not present

## 2019-11-11 DIAGNOSIS — M5431 Sciatica, right side: Secondary | ICD-10-CM | POA: Diagnosis not present

## 2019-11-16 DIAGNOSIS — M5431 Sciatica, right side: Secondary | ICD-10-CM | POA: Diagnosis not present

## 2019-11-16 DIAGNOSIS — M9903 Segmental and somatic dysfunction of lumbar region: Secondary | ICD-10-CM | POA: Diagnosis not present

## 2019-11-16 DIAGNOSIS — M9905 Segmental and somatic dysfunction of pelvic region: Secondary | ICD-10-CM | POA: Diagnosis not present

## 2019-11-16 DIAGNOSIS — M9902 Segmental and somatic dysfunction of thoracic region: Secondary | ICD-10-CM | POA: Diagnosis not present

## 2019-11-23 DIAGNOSIS — M9905 Segmental and somatic dysfunction of pelvic region: Secondary | ICD-10-CM | POA: Diagnosis not present

## 2019-11-23 DIAGNOSIS — M9902 Segmental and somatic dysfunction of thoracic region: Secondary | ICD-10-CM | POA: Diagnosis not present

## 2019-11-23 DIAGNOSIS — M9903 Segmental and somatic dysfunction of lumbar region: Secondary | ICD-10-CM | POA: Diagnosis not present

## 2019-11-23 DIAGNOSIS — M5431 Sciatica, right side: Secondary | ICD-10-CM | POA: Diagnosis not present

## 2019-11-29 DIAGNOSIS — M9905 Segmental and somatic dysfunction of pelvic region: Secondary | ICD-10-CM | POA: Diagnosis not present

## 2019-11-29 DIAGNOSIS — M9903 Segmental and somatic dysfunction of lumbar region: Secondary | ICD-10-CM | POA: Diagnosis not present

## 2019-11-29 DIAGNOSIS — M5431 Sciatica, right side: Secondary | ICD-10-CM | POA: Diagnosis not present

## 2019-11-29 DIAGNOSIS — M9902 Segmental and somatic dysfunction of thoracic region: Secondary | ICD-10-CM | POA: Diagnosis not present

## 2021-03-30 ENCOUNTER — Telehealth: Payer: Self-pay | Admitting: *Deleted

## 2021-03-30 NOTE — Telephone Encounter (Signed)
Referral on file from Dr Nadyne Coombes at [facility], [phone]. Referral sent to scheduling.

## 2021-11-27 ENCOUNTER — Ambulatory Visit (HOSPITAL_BASED_OUTPATIENT_CLINIC_OR_DEPARTMENT_OTHER): Payer: BLUE CROSS/BLUE SHIELD | Admitting: Family Medicine

## 2021-12-11 ENCOUNTER — Ambulatory Visit (HOSPITAL_BASED_OUTPATIENT_CLINIC_OR_DEPARTMENT_OTHER): Payer: BLUE CROSS/BLUE SHIELD | Admitting: Family Medicine

## 2021-12-13 ENCOUNTER — Ambulatory Visit (HOSPITAL_BASED_OUTPATIENT_CLINIC_OR_DEPARTMENT_OTHER): Payer: BLUE CROSS/BLUE SHIELD | Admitting: Family Medicine

## 2021-12-27 ENCOUNTER — Ambulatory Visit (INDEPENDENT_AMBULATORY_CARE_PROVIDER_SITE_OTHER): Payer: BC Managed Care – PPO

## 2021-12-27 ENCOUNTER — Ambulatory Visit (INDEPENDENT_AMBULATORY_CARE_PROVIDER_SITE_OTHER): Payer: BC Managed Care – PPO | Admitting: Podiatry

## 2021-12-27 ENCOUNTER — Encounter: Payer: Self-pay | Admitting: Podiatry

## 2021-12-27 ENCOUNTER — Other Ambulatory Visit: Payer: Self-pay

## 2021-12-27 DIAGNOSIS — M21612 Bunion of left foot: Secondary | ICD-10-CM

## 2021-12-27 DIAGNOSIS — L84 Corns and callosities: Secondary | ICD-10-CM

## 2021-12-27 DIAGNOSIS — M21611 Bunion of right foot: Secondary | ICD-10-CM | POA: Diagnosis not present

## 2021-12-27 DIAGNOSIS — M21619 Bunion of unspecified foot: Secondary | ICD-10-CM

## 2021-12-27 DIAGNOSIS — L600 Ingrowing nail: Secondary | ICD-10-CM

## 2021-12-27 NOTE — Patient Instructions (Signed)
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
## 2022-01-15 ENCOUNTER — Ambulatory Visit: Payer: Self-pay | Admitting: Family Medicine

## 2022-01-18 ENCOUNTER — Ambulatory Visit: Payer: Self-pay | Admitting: Family Medicine

## 2022-08-22 ENCOUNTER — Other Ambulatory Visit (HOSPITAL_BASED_OUTPATIENT_CLINIC_OR_DEPARTMENT_OTHER): Payer: Self-pay

## 2022-08-22 MED ORDER — COVID-19 MRNA 2023-2024 VACCINE (COMIRNATY) 0.3 ML INJECTION
INTRAMUSCULAR | 0 refills | Status: AC
  Filled 2022-08-22: qty 0.3, 1d supply, fill #0

## 2022-08-22 MED ORDER — INFLUENZA VAC SPLIT QUAD 0.5 ML IM SUSY
PREFILLED_SYRINGE | INTRAMUSCULAR | 0 refills | Status: AC
  Filled 2022-08-22: qty 0.5, 1d supply, fill #0

## 2023-10-16 ENCOUNTER — Encounter: Payer: Self-pay | Admitting: Family Medicine

## 2023-10-16 ENCOUNTER — Ambulatory Visit (INDEPENDENT_AMBULATORY_CARE_PROVIDER_SITE_OTHER): Payer: BC Managed Care – PPO

## 2023-10-16 ENCOUNTER — Ambulatory Visit: Payer: Self-pay | Admitting: Family Medicine

## 2023-10-16 VITALS — BP 108/62 | HR 53 | Ht 70.0 in | Wt 164.0 lb

## 2023-10-16 DIAGNOSIS — M9904 Segmental and somatic dysfunction of sacral region: Secondary | ICD-10-CM

## 2023-10-16 DIAGNOSIS — M545 Low back pain, unspecified: Secondary | ICD-10-CM | POA: Insufficient documentation

## 2023-10-16 DIAGNOSIS — M25551 Pain in right hip: Secondary | ICD-10-CM | POA: Diagnosis not present

## 2023-10-16 DIAGNOSIS — M25552 Pain in left hip: Secondary | ICD-10-CM

## 2023-10-16 DIAGNOSIS — M9902 Segmental and somatic dysfunction of thoracic region: Secondary | ICD-10-CM | POA: Diagnosis not present

## 2023-10-16 DIAGNOSIS — M9903 Segmental and somatic dysfunction of lumbar region: Secondary | ICD-10-CM

## 2023-10-16 NOTE — Progress Notes (Signed)
[name] [address] [place] [address] Phone: [phone] Subjective:   INadine Counts, am serving as a scribe for Dr. Antoine Primas.  I'm seeing this patient by the request  of:  Jones Bales, MD  CC: Low back and hip pain  [id number]  Patti Brannigan is a 52 y.o. male coming in with complaint of hip and lower back pain. Is a runner. In the last 6 months hips are aching starting run and while running. Why? Not stopping him from running. Not going as hard or as far. In the last 2 weeks low back seized up, didn't feel like muscle pain a little deeper. Not as pronounced today. Did take some Aleve which helped. No other home treatment.   Patient did have x-rays previously in 2019 that were independently visualized by me.,  Specific bony abnormality.    Past Medical History:  Diagnosis Date   Piriformis syndrome of left side 05/03/2015   Retrocalcaneal bursitis 05/03/2015   Past Surgical History:  Procedure Laterality Date   WISDOM TOOTH EXTRACTION     Social History   Socioeconomic History   Marital status: Married    Spouse name: Not on file   Number of children: Not on file   Years of education: Not on file   Highest education level: Not on file  Occupational History   Not on file  Tobacco Use   Smoking status: Never   Smokeless tobacco: Never  Substance and Sexual Activity   Alcohol use: Yes    Alcohol/week: 0.0 standard drinks of alcohol    Comment: occasionally   Drug use: No   Sexual activity: Not on file  Other Topics Concern   Not on file  Social History Narrative   Not on file   Social Determinants of Health   Financial Resource Strain: Not on file  Food Insecurity: No Food Insecurity (07/25/2022)   Received from [facility]   Hunger Vital Sign    Worried About Running Out of Food in the Last Year: Never true    Ran Out of Food in the Last Year: Never true  Transportation Needs: Not on file  Physical  Activity: Not on file  Stress: Not on file  Social Connections: Unknown (04/24/2022)   Received from [employer]   Social Network    Social Network: Not on file   No Known Allergies Family History  Problem Relation Age of Onset   Hypertension Mother    Hypertension Father    Other Father        abnormal EKG's   Heart failure Neg Hx    Fainting Neg Hx    Stroke Neg Hx    Heart disease Paternal Grandmother      Current Outpatient Medications (Cardiovascular):    nitroGLYCERIN (NITRODUR - DOSED IN MG/24 HR) 0.2 mg/hr patch, Apply 1/4th patch to affected area, change daily   Current Outpatient Medications (Analgesics):    meloxicam (MOBIC) 15 MG tablet, Take 15 mg by mouth daily.   Current Outpatient Medications (Other):    COVID-19 mRNA vaccine 2023-2024 (COMIRNATY) SUSP injection, Inject into the muscle.   influenza vac split quadrivalent PF (FLUARIX) 0.5 ML injection, Inject into the muscle.   Omega-3 Fatty Acids (FISH OIL) 1200 MG CAPS, 1 capsule   Reviewed prior external information including notes and imaging from  primary care provider As well as notes that were available from care everywhere and other healthcare systems.  Past medical  history, social, surgical and family history all reviewed in electronic medical record.  No pertanent information unless stated regarding to the chief complaint.   Review of Systems:  No headache, visual changes, nausea, vomiting, diarrhea, constipation, dizziness, abdominal pain, skin rash, fevers, chills, night sweats, weight loss, swollen lymph nodes, body aches, joint swelling, chest pain, shortness of breath, mood changes. POSITIVE muscle aches  Objective  Blood pressure 108/62, pulse (!) 53, height 5\' 10"  (1.778 m), weight 164 lb (74.4 kg), SpO2 99%.   General: No apparent distress alert and oriented x3 mood and affect normal, dressed appropriately.  HEENT: Pupils equal, extraocular movements intact  Respiratory: Patient's  speak in full sentences and does not appear short of breath  Cardiovascular: No lower extremity edema, non tender, no erythema  Low back does have tightness of the hamstrings bilaterally.  Weakness of the hip abductor noted.  Some tightness of the right hip flexor.  Some mild limited range of motion with internal rotation on the left hip noted today.  Osteopathic findings T9 extended rotated and side bent left L2 flexed rotated and side bent right Sacrum left on left   97110; 15 additional minutes spent for Therapeutic exercises as stated in above notes.  This included exercises focusing on stretching, strengthening, with significant focus on eccentric aspects.   Long term goals include an improvement in range of motion, strength, endurance as well as avoiding reinjury. Patient's frequency would include in 1-2 times a day, 3-5 times a week for a duration of 6-12 weeks. Low back exercises that included:  Pelvic tilt/bracing instruction to focus on control of the pelvic girdle and lower abdominal muscles  Glute strengthening exercises, focusing on proper firing of the glutes without engaging the low back muscles Proper stretching techniques for maximum relief for the hamstrings, hip flexors, low back and some rotation where tolerated  Proper technique shown and discussed handout in great detail with ATC.  All questions were discussed and answered.     Impression and Recommendations:    Low back pain Low back does have what appears to be more multifactorial.  Discussed icing regimen and home exercises, which activities to do and which ones to avoid.  Have some weakness in the hip abductors still think is contributing.  Discussed avoiding certain activities.  Follow-up with me again in 6 to 8 weeks.  If continuing to have difficulty will consider the possibility of formal physical therapy.   Decision today to treat with OMT was based on Physical Exam  After verbal consent patient was treated with  HVLA, ME, FPR techniques in  thoracic, lumbar and sacral areas, all areas are chronic   Patient tolerated the procedure well with improvement in symptoms  Patient given exercises, stretches and lifestyle modifications  See medications in patient instructions if given  Patient will follow up in 4-8 weeks The above documentation has been reviewed and is accurate and complete Judi Saa, DO

## 2023-10-16 NOTE — Assessment & Plan Note (Signed)
Low back does have what appears to be more multifactorial.  Discussed icing regimen and home exercises, which activities to do and which ones to avoid.  Have some weakness in the hip abductors still think is contributing.  Discussed avoiding certain activities.  Follow-up with me again in 6 to 8 weeks.  If continuing to have difficulty will consider the possibility of formal physical therapy.

## 2023-10-16 NOTE — Patient Instructions (Signed)
Do prescribed exercises at least 3x a week See you again in 6-8 weeks 

## 2023-11-03 ENCOUNTER — Ambulatory Visit: Payer: Self-pay | Admitting: Family Medicine

## 2023-12-03 NOTE — Progress Notes (Unsigned)
  [name] [address] [place] [address] Phone: [phone] Subjective:   INadine Counts, am serving as a scribe for Dr. Antoine Primas.  I'm seeing this patient by the request  of:  Jones Bales, MD  CC: back and neck pain follow up   [id number]  Kyle Turner is a 53 y.o. male coming in with complaint of back and neck pain. OMT 10/16/2023. Patient states doing well. Working on hips. Has a bit more flexibility. Still has a bit of tension and discomfort.   Medications patient has been prescribed: None  Taking:         Reviewed prior external information including notes and imaging from previsou exam, outside providers and external EMR if available.   As well as notes that were available from care everywhere and other healthcare systems.  Past medical history, social, surgical and family history all reviewed in electronic medical record.  No pertanent information unless stated regarding to the chief complaint.   Past Medical History:  Diagnosis Date   Piriformis syndrome of left side 05/03/2015   Retrocalcaneal bursitis 05/03/2015    No Known Allergies   Review of Systems:  No headache, visual changes, nausea, vomiting, diarrhea, constipation, dizziness, abdominal pain, skin rash, fevers, chills, night sweats, weight loss, swollen lymph nodes, body aches, joint swelling, chest pain, shortness of breath, mood changes. POSITIVE muscle aches  Objective  Blood pressure 118/72, pulse (!) 59, height 5\' 10"  (1.778 m), weight 162 lb (73.5 kg), SpO2 98%.   General: No apparent distress alert and oriented x3 mood and affect normal, dressed appropriately.  HEENT: Pupils equal, extraocular movements intact  Respiratory: Patient's speak in full sentences and does not appear short of breath  Cardiovascular: No lower extremity edema, non tender, no erythema  Gait MSK:  Back   Osteopathic findings T9 extended rotated and side bent  left L2 flexed rotated and side bent right Sacrum right on right       Assessment and Plan:  Low back pain Continuing to have difficulty with the low back pain and do think that there is some radicular symptoms would look at the degenerative disc disease noted.  We discussed with patient again about different treatment options.  Patient was to continue with conservative therapy at the moment.  Discussed which activities to do and which ones to avoid.  Follow-up again in 6 to 8 weeks.    Nonallopathic problems  Decision today to treat with OMT was based on Physical Exam  After verbal consent patient was treated with HVLA, ME, FPR techniques in  thoracic, lumbar, and sacral  areas  Patient tolerated the procedure well with improvement in symptoms  Patient given exercises, stretches and lifestyle modifications  See medications in patient instructions if given  Patient will follow up in 4-8 weeks    The above documentation has been reviewed and is accurate and complete Judi Saa, DO          Note: This dictation was prepared with Dragon dictation along with smaller phrase technology. Any transcriptional errors that result from this process are unintentional.

## 2023-12-04 ENCOUNTER — Ambulatory Visit: Payer: BC Managed Care – PPO | Admitting: Family Medicine

## 2023-12-04 ENCOUNTER — Encounter: Payer: Self-pay | Admitting: Family Medicine

## 2023-12-04 VITALS — BP 118/72 | HR 59 | Ht 70.0 in | Wt 162.0 lb

## 2023-12-04 DIAGNOSIS — M9902 Segmental and somatic dysfunction of thoracic region: Secondary | ICD-10-CM | POA: Diagnosis not present

## 2023-12-04 DIAGNOSIS — M9904 Segmental and somatic dysfunction of sacral region: Secondary | ICD-10-CM | POA: Diagnosis not present

## 2023-12-04 DIAGNOSIS — M5442 Lumbago with sciatica, left side: Secondary | ICD-10-CM | POA: Diagnosis not present

## 2023-12-04 DIAGNOSIS — M9903 Segmental and somatic dysfunction of lumbar region: Secondary | ICD-10-CM

## 2023-12-04 DIAGNOSIS — G8929 Other chronic pain: Secondary | ICD-10-CM

## 2023-12-04 NOTE — Patient Instructions (Signed)
Keep working on hip abductor strengthening Overall think we are doing well See you again in 6-8 weeks

## 2023-12-04 NOTE — Assessment & Plan Note (Signed)
Continuing to have difficulty with the low back pain and do think that there is some radicular symptoms would look at the degenerative disc disease noted.  We discussed with patient again about different treatment options.  Patient was to continue with conservative therapy at the moment.  Discussed which activities to do and which ones to avoid.  Follow-up again in 6 to 8 weeks.

## 2024-01-22 ENCOUNTER — Ambulatory Visit: Payer: BC Managed Care – PPO | Admitting: Family Medicine

## 2024-08-02 ENCOUNTER — Encounter (INDEPENDENT_AMBULATORY_CARE_PROVIDER_SITE_OTHER): Admitting: Ophthalmology

## 2024-08-02 DIAGNOSIS — H33301 Unspecified retinal break, right eye: Secondary | ICD-10-CM

## 2024-08-02 DIAGNOSIS — H43813 Vitreous degeneration, bilateral: Secondary | ICD-10-CM

## 2024-08-02 DIAGNOSIS — H2513 Age-related nuclear cataract, bilateral: Secondary | ICD-10-CM

## 2024-08-13 ENCOUNTER — Encounter (INDEPENDENT_AMBULATORY_CARE_PROVIDER_SITE_OTHER): Admitting: Ophthalmology

## 2024-08-13 DIAGNOSIS — H33301 Unspecified retinal break, right eye: Secondary | ICD-10-CM

## 2024-11-02 ENCOUNTER — Encounter (INDEPENDENT_AMBULATORY_CARE_PROVIDER_SITE_OTHER): Admitting: Ophthalmology

## 2024-11-18 ENCOUNTER — Encounter (INDEPENDENT_AMBULATORY_CARE_PROVIDER_SITE_OTHER): Admitting: Ophthalmology

## 2024-12-13 ENCOUNTER — Encounter (INDEPENDENT_AMBULATORY_CARE_PROVIDER_SITE_OTHER): Admitting: Ophthalmology

## 2024-12-21 ENCOUNTER — Encounter (INDEPENDENT_AMBULATORY_CARE_PROVIDER_SITE_OTHER): Admitting: Ophthalmology
